# Patient Record
Sex: Female | Born: 1950
Health system: Southern US, Community
[De-identification: ages and names within clinical notes are randomized; demographics above are authoritative.]

## PROBLEM LIST (undated history)

## (undated) DIAGNOSIS — R32 Unspecified urinary incontinence: Secondary | ICD-10-CM

## (undated) DIAGNOSIS — E785 Hyperlipidemia, unspecified: Secondary | ICD-10-CM

## (undated) DIAGNOSIS — I10 Essential (primary) hypertension: Secondary | ICD-10-CM

## (undated) HISTORY — PX: CATARACT EXTRACTION: SUR2

## (undated) HISTORY — PX: TUBAL LIGATION: SHX77

## (undated) HISTORY — DX: Unspecified urinary incontinence: R32

## (undated) HISTORY — DX: Essential (primary) hypertension: I10

## (undated) HISTORY — DX: Hyperlipidemia, unspecified: E78.5

---

## 2000-04-23 ENCOUNTER — Other Ambulatory Visit: Admission: RE | Admit: 2000-04-23 | Discharge: 2000-04-23 | Payer: Self-pay | Admitting: Internal Medicine

## 2004-08-15 ENCOUNTER — Other Ambulatory Visit: Admission: RE | Admit: 2004-08-15 | Discharge: 2004-08-15 | Payer: Self-pay | Admitting: Internal Medicine

## 2005-09-09 ENCOUNTER — Ambulatory Visit (HOSPITAL_COMMUNITY): Admission: RE | Admit: 2005-09-09 | Discharge: 2005-09-09 | Payer: Self-pay | Admitting: Internal Medicine

## 2006-09-14 ENCOUNTER — Ambulatory Visit (HOSPITAL_COMMUNITY): Admission: RE | Admit: 2006-09-14 | Discharge: 2006-09-14 | Payer: Self-pay | Admitting: Internal Medicine

## 2007-06-23 ENCOUNTER — Encounter (INDEPENDENT_AMBULATORY_CARE_PROVIDER_SITE_OTHER): Payer: Self-pay | Admitting: *Deleted

## 2007-06-23 ENCOUNTER — Ambulatory Visit (HOSPITAL_COMMUNITY): Admission: RE | Admit: 2007-06-23 | Discharge: 2007-06-23 | Payer: Self-pay | Admitting: *Deleted

## 2010-08-07 ENCOUNTER — Other Ambulatory Visit
Admission: RE | Admit: 2010-08-07 | Discharge: 2010-08-07 | Payer: Self-pay | Source: Home / Self Care | Admitting: Internal Medicine

## 2011-04-01 NOTE — Op Note (Signed)
Kelli Mcintyre, Kelli Mcintyre NO.:  [id number]   MEDICAL RECORD NO.:  [id number]          PATIENT TYPE:  AMB   LOCATION:  ENDO                         FACILITY:  [facility]   PHYSICIAN:  Georgiana Spinner, M.D.    DATE OF BIRTH:  1951/03/28   DATE OF PROCEDURE:  DATE OF DISCHARGE:                               OPERATIVE REPORT   PROCEDURE:  Colonoscopy.   INDICATIONS:  Colon polyps, colon cancer screening.   ANESTHESIA:  Fentanyl 75 mcg, Versed 7 mg.   PROCEDURE:  With the patient mildly sedated in the left lateral  decubitus position, subsequently the Pentax videoscopic colonoscope was  inserted to the rectum and passed under direct vision with pressure  applied through a diverticula filled tortuous sigmoid colon.  We reached  the cecum identified by ileocecal valve and appendiceal orifice, both of  which were photographed.  From this point colonoscope was slowly  withdrawn, taking circumferential views of colonic mucosa stopping in  the descending colon where a polyp was seen, photographed and removed  using hot biopsy forceps technique setting of 20/150 blended current.  We next stopped to photograph diverticula seen in the sigmoid colon  until we reached the rectum where a small polyp was seen and removed  using again hot biopsy forceps technique the same setting and in the  rectum we placed the endoscope on retroflexed view to view the anal  canal from above.  Internal hemorrhoids were seen and photographed.  The  endoscope was straightened and withdrawn.  The patient's vital signs,  pulse oximeter remained stable.  The patient tolerated procedure well  without apparent complications.   FINDINGS:  Tortuous colon with diverticulosis of sigmoid colon, internal  hemorrhoids and polyps of rectum and descending colon.  Await biopsy  report.  The patient will call me for results and follow-up with me as  an outpatient.           ______________________________  Georgiana Spinner, M.D.     GMO/MEDQ  D:  06/23/2007  T:  06/23/2007  Job:  [id number]

## 2015-07-25 DIAGNOSIS — L255 Unspecified contact dermatitis due to plants, except food: Secondary | ICD-10-CM | POA: Diagnosis not present

## 2016-01-01 DIAGNOSIS — N644 Mastodynia: Secondary | ICD-10-CM | POA: Diagnosis not present

## 2016-01-03 DIAGNOSIS — H5711 Ocular pain, right eye: Secondary | ICD-10-CM | POA: Diagnosis not present

## 2016-01-31 ENCOUNTER — Encounter: Payer: Self-pay | Admitting: Family Medicine

## 2016-03-27 ENCOUNTER — Other Ambulatory Visit: Payer: Self-pay | Admitting: Family Medicine

## 2016-03-27 ENCOUNTER — Other Ambulatory Visit (HOSPITAL_COMMUNITY)
Admission: RE | Admit: 2016-03-27 | Discharge: 2016-03-27 | Disposition: A | Discharge status: Home / Self Care | Payer: Medicare Other | Source: Ambulatory Visit | Attending: Internal Medicine | Admitting: Internal Medicine

## 2016-03-27 DIAGNOSIS — Z01419 Encounter for gynecological examination (general) (routine) without abnormal findings: Secondary | ICD-10-CM | POA: Diagnosis present

## 2016-04-01 LAB — CYTOLOGY - PAP

## 2016-08-31 ENCOUNTER — Encounter: Payer: Self-pay | Admitting: *Deleted

## 2017-01-02 DIAGNOSIS — Z1231 Encounter for screening mammogram for malignant neoplasm of breast: Secondary | ICD-10-CM | POA: Diagnosis not present

## 2017-03-12 DIAGNOSIS — I10 Essential (primary) hypertension: Secondary | ICD-10-CM | POA: Diagnosis not present

## 2017-03-12 DIAGNOSIS — Z0000 Encounter for general adult medical examination without abnormal findings: Secondary | ICD-10-CM | POA: Diagnosis not present

## 2017-03-12 LAB — LIPID PANEL
Cholesterol: 235 — AB (ref 0–200)
HDL: 54 (ref 35–70)
LDL Cholesterol: 155
Triglycerides: 131 (ref 40–160)

## 2017-03-12 LAB — BASIC METABOLIC PANEL
BUN: 18 (ref 4–21)
Creatinine: 1 (ref 0.5–1.1)
Glucose: 101
POTASSIUM: 4.9 (ref 3.4–5.3)
Sodium: 140 (ref 137–147)

## 2017-03-12 LAB — CBC AND DIFFERENTIAL
HCT: 42 (ref 36–46)
Hemoglobin: 14.3 (ref 12.0–16.0)
Platelets: 230 (ref 150–399)
WBC: 6.7

## 2017-03-12 LAB — HEPATIC FUNCTION PANEL
ALK PHOS: 51 (ref 25–125)
ALT: 21 (ref 7–35)
AST: 26 (ref 13–35)

## 2017-03-17 DIAGNOSIS — M4316 Spondylolisthesis, lumbar region: Secondary | ICD-10-CM | POA: Diagnosis not present

## 2017-03-17 DIAGNOSIS — M5441 Lumbago with sciatica, right side: Secondary | ICD-10-CM | POA: Diagnosis not present

## 2017-03-17 DIAGNOSIS — M8580 Other specified disorders of bone density and structure, unspecified site: Secondary | ICD-10-CM | POA: Diagnosis not present

## 2017-03-17 DIAGNOSIS — I10 Essential (primary) hypertension: Secondary | ICD-10-CM | POA: Diagnosis not present

## 2017-03-17 DIAGNOSIS — Z0000 Encounter for general adult medical examination without abnormal findings: Secondary | ICD-10-CM | POA: Diagnosis not present

## 2017-03-17 DIAGNOSIS — Z780 Asymptomatic menopausal state: Secondary | ICD-10-CM | POA: Diagnosis not present

## 2017-03-25 DIAGNOSIS — M545 Low back pain: Secondary | ICD-10-CM | POA: Diagnosis not present

## 2017-03-25 DIAGNOSIS — M5431 Sciatica, right side: Secondary | ICD-10-CM | POA: Diagnosis not present

## 2017-03-30 DIAGNOSIS — M545 Low back pain: Secondary | ICD-10-CM | POA: Diagnosis not present

## 2017-03-30 DIAGNOSIS — M5431 Sciatica, right side: Secondary | ICD-10-CM | POA: Diagnosis not present

## 2017-06-09 ENCOUNTER — Telehealth: Payer: Self-pay | Admitting: Family Medicine

## 2017-06-09 NOTE — Telephone Encounter (Signed)
[facility]

## 2017-06-12 ENCOUNTER — Encounter: Payer: Self-pay | Admitting: *Deleted

## 2017-06-12 ENCOUNTER — Telehealth: Payer: Self-pay | Admitting: *Deleted

## 2017-06-12 NOTE — Telephone Encounter (Signed)
Spoke to pt, went over pt's history pt only told me no allergies, and no surgeries said she has medications written down on paperwork she filled out. Told her okay. Asked her why she was coming in to see Dr. Earlene PlaterWallace? Pt said she is having leg and back pain. Told her okay thank you will see you Monday. Pt verbalized understanding.

## 2017-06-15 ENCOUNTER — Encounter: Payer: Self-pay | Admitting: Family Medicine

## 2017-06-15 ENCOUNTER — Ambulatory Visit (INDEPENDENT_AMBULATORY_CARE_PROVIDER_SITE_OTHER): Payer: Medicare Other | Admitting: Family Medicine

## 2017-06-15 VITALS — BP 146/90 | HR 75 | Temp 97.9°F | Ht 60.5 in | Wt 156.0 lb

## 2017-06-15 DIAGNOSIS — G8929 Other chronic pain: Secondary | ICD-10-CM

## 2017-06-15 DIAGNOSIS — M5441 Lumbago with sciatica, right side: Secondary | ICD-10-CM | POA: Diagnosis not present

## 2017-06-15 DIAGNOSIS — M533 Sacrococcygeal disorders, not elsewhere classified: Secondary | ICD-10-CM

## 2017-06-15 DIAGNOSIS — M47816 Spondylosis without myelopathy or radiculopathy, lumbar region: Secondary | ICD-10-CM | POA: Insufficient documentation

## 2017-06-15 DIAGNOSIS — I10 Essential (primary) hypertension: Secondary | ICD-10-CM | POA: Diagnosis not present

## 2017-06-15 DIAGNOSIS — M549 Dorsalgia, unspecified: Secondary | ICD-10-CM | POA: Insufficient documentation

## 2017-06-15 MED ORDER — METHYLPREDNISOLONE ACETATE 80 MG/ML IJ SUSP
80.0000 mg | Freq: Once | INTRAMUSCULAR | Status: AC
  Administered 2017-06-15: 80 mg via INTRAMUSCULAR

## 2017-06-15 MED ORDER — LOSARTAN POTASSIUM 100 MG PO TABS: 100.0000 mg | ORAL_TABLET | Freq: Every day | ORAL | 3 refills | Status: DC

## 2017-06-15 MED ORDER — DICLOFENAC SODIUM 75 MG PO TBEC: 75.0000 mg | DELAYED_RELEASE_TABLET | Freq: Two times a day (BID) | ORAL | 3 refills | Status: DC

## 2017-06-15 NOTE — Patient Instructions (Addendum)
You will be contacted to schedule the MRI.  START diclofenac 1 tablet twice daily.  Stop Valsartan next Monday (June 22, 2017)  Start Losartan 1 tablet daily next Monday (June 22, 2017)  Follow up in 1 month.

## 2017-06-15 NOTE — Progress Notes (Signed)
  Review of Systems  Constitutional: Negative for chills, fever, malaise/fatigue and weight loss.  HENT: Negative for hearing loss, sinus pain and sore throat.   Eyes: Negative for blurred vision.  Respiratory: Negative for cough and shortness of breath.   Cardiovascular: Negative for chest pain, palpitations and leg swelling.  Gastrointestinal: Negative for abdominal pain, constipation, diarrhea, heartburn, nausea and vomiting.  Genitourinary: Negative for dysuria, frequency and urgency.  Musculoskeletal: Positive for back pain. Negative for myalgias and neck pain.  Skin: Negative for itching and rash.  Neurological: Negative for dizziness, tingling, seizures, loss of consciousness and headaches.  Endo/Heme/Allergies: Negative for polydipsia.  Psychiatric/Behavioral: Negative for depression. The patient is not nervous/anxious.    Physical Exam

## 2017-06-15 NOTE — Progress Notes (Signed)
Kelli Mcintyre is a 66 y.o. [facility]   Patient Care Team: Helane RimaWallace, Bobbiejo Ishikawa, DO as PCP - General (Family Medicine)   History of Present Illness:   Kelli Mcintyre, CMA, acting as scribe for Dr. Earlene PlaterWallace.  Patient accompanied by interpreter today.  HPI:  Patient states she has pain in her lower back that radiates down her right thigh.  This has been occurring since winter.  States she has never been told she has arthritis.  Takes Advil occasionally and it provides some relief.  She is able to sleep well.  This only bothers her when walking.  She has pain in her lower back upon palpation.  No pain when bending forward.  Mild pain when bending backward.  Patient states it is a mild discomfort.  No pain when twisting or bending side to side.   She went to physical therapy a few times.  She states physical therapy was not helpful.  Patient does not work.  States she takes care of her garden.  She has not tried prednisone or injections for this.  She tried acupuncture but states it did not work.    Patient is taking valsartan for her blood pressure.  This will be changed to Losartan today due to product recall.  Patient states she had lab work done on Mar 21, 2017.  We will request those records.  Health Maintenance Due  Topic Date Due  . Hepatitis C Screening  [birth year]  . TETANUS/TDAP  03/21/1969  . COLONOSCOPY  03/21/2000  . MAMMOGRAM  09/14/2008  . DEXA SCAN  03/22/2015  . PNA vac Low Risk Adult (1 of 2 - PCV13) 03/22/2015   Depression screen PHQ 2/9 06/15/2017  Decreased Interest 0  Down, Depressed, Hopeless 0  PHQ - 2 Score 0   PMHx, SurgHx, SocialHx, Medications, and Allergies were reviewed in the Visit Navigator and updated as appropriate.   Past Medical History:  Diagnosis Date  . Hypertension    History reviewed. No pertinent surgical history. History reviewed. No pertinent family history. Social History  Substance Use Topics  . Smoking status: Never  Smoker  . Smokeless tobacco: Never Used  . Alcohol use No   Current Medications and Allergies:   .  Ascorbic Acid (VITAMIN C) 1000 MG tablet, Take 1,000 mg by mouth daily., Disp: , Rfl:  .  Calcium-Magnesium-Vitamin D (CALCIUM 500 PO), Take 1 tablet by mouth 2 (two) times daily., Disp: , Rfl:  .  Glucos-Chondroit-Hyaluron-MSM (GLUCOSAMINE CHONDROITIN JOINT PO), Take by mouth. Pt takes liquid, 2 tablespoons daily., Disp: , Rfl:  .  Multiple Vitamin (MULTIVITAMIN) tablet, Take 1 tablet by mouth daily., Disp: , Rfl:  .  valsartan (DIOVAN) 320 MG tablet, Take 320 mg by mouth daily., Disp: , Rfl: 4 .  diclofenac (VOLTAREN) 75 MG EC tablet, Take 1 tablet (75 mg total) by mouth 2 (two) times daily., Disp: 30 tablet, Rfl: 3 .  losartan (COZAAR) 100 MG tablet, Take 1 tablet (100 mg total) by mouth daily., Disp: 30 tablet, Rfl: 3  No Known Allergies   Review of Systems:   Pertinent items are noted in the HPI. Otherwise, ROS is negative.  Vitals:   Vitals:   06/15/17 0934  BP: (!) 146/90  Pulse: 75  Temp: 97.9 F (36.6 C)  TempSrc: Oral  SpO2: 96%  Weight: 156 lb (70.8 kg)  Height: 5' 0.5" (1.537 m)     Body mass index is 29.97 kg/m.  Physical  Exam:   Physical Exam  Constitutional: She appears well-developed and well-nourished. No distress.  HENT:  Head: Normocephalic and atraumatic.  Right Ear: External ear normal.  Left Ear: External ear normal.  Nose: Nose normal.  Mouth/Throat: Oropharynx is clear and moist.  Eyes: Pupils are equal, round, and reactive to light. EOM are normal.  Neck: Normal range of motion. Neck supple.  Cardiovascular: Normal rate, regular rhythm, normal heart sounds and intact distal pulses.   Pulmonary/Chest: Effort normal and breath sounds normal.  Abdominal: Soft.  Musculoskeletal:       Back:       Legs: ttp bilateral SI joints, + SLR right  Skin: Skin is warm.  Psychiatric: She has a normal mood and affect. Her behavior is normal.  Nursing  note and vitals reviewed.   Assessment and Plan:   Kelli Mcintyre was seen today for establish care and back pain.  Diagnoses and all orders for this visit:  Chronic bilateral low back pain with right-sided sciatica Comments: Worsening. Not improved with PT. Hx of XRAY. Will request records. See intervention below. Red flags reviewed. Orders: -     MR Lumbar Spine Wo Contrast; Future -     diclofenac (VOLTAREN) 75 MG EC tablet; Take 1 tablet (75 mg total) by mouth 2 (two) times daily. -     methylPREDNISolone acetate (DEPO-MEDROL) injection 80 mg; Inject 1 mL (80 mg total) into the muscle once.  Essential hypertension Comments: Will change medication to Cozaar due to Diovan recall.  Orders: -     losartan (COZAAR) 100 MG tablet; Take 1 tablet (100 mg total) by mouth daily.  SI (sacroiliac) pain Comments: Chronic. Worsening. Not improving with PT. See orders above.   . Reviewed expectations re: course of current medical issues. . Discussed self-management of symptoms. . Outlined signs and symptoms indicating need for more acute intervention. . Patient verbalized understanding and all questions were answered. Marland Kitchen. Health Maintenance issues including appropriate healthy diet, exercise, and smoking avoidance were discussed with patient. . See orders for this visit as documented in the electronic medical record. . Patient received an After Visit Summary.  CMA served as Neurosurgeonscribe during this visit. History, Physical, and Plan performed by medical provider. The above documentation has been reviewed and is accurate and complete. Helane RimaErica Lyrika Souders, D.O.  Helane RimaErica Amario Longmore, DO [place] 06/15/2017  Future Appointments Date Time Provider Department Center  09/17/2017 9:15 AM Helane RimaWallace, Carling Liberman, DO [facility] None

## 2017-06-16 ENCOUNTER — Encounter: Payer: Self-pay | Admitting: Family Medicine

## 2017-06-29 ENCOUNTER — Ambulatory Visit
Admission: RE | Admit: 2017-06-29 | Discharge: 2017-06-29 | Disposition: A | Discharge status: Home / Self Care | Payer: Medicare Other | Source: Ambulatory Visit | Attending: Family Medicine | Admitting: Family Medicine

## 2017-06-29 DIAGNOSIS — M5441 Lumbago with sciatica, right side: Principal | ICD-10-CM

## 2017-06-29 DIAGNOSIS — M48061 Spinal stenosis, lumbar region without neurogenic claudication: Secondary | ICD-10-CM | POA: Diagnosis not present

## 2017-06-29 DIAGNOSIS — G8929 Other chronic pain: Secondary | ICD-10-CM

## 2017-07-03 ENCOUNTER — Telehealth: Payer: Self-pay | Admitting: Family Medicine

## 2017-07-03 NOTE — Telephone Encounter (Signed)
Patient's husband called in reference to MRI results for patient. MRI was 06/22/17. Please call patient and advise. Ok to leave message.

## 2017-07-04 ENCOUNTER — Encounter: Payer: Self-pay | Admitting: Family Medicine

## 2017-07-06 NOTE — Telephone Encounter (Signed)
I have sent My Chart message for Dr. Earlene Plater to answer.

## 2017-07-29 ENCOUNTER — Encounter: Payer: Self-pay | Admitting: *Deleted

## 2017-07-29 DIAGNOSIS — Z23 Encounter for immunization: Secondary | ICD-10-CM

## 2017-09-17 ENCOUNTER — Ambulatory Visit (INDEPENDENT_AMBULATORY_CARE_PROVIDER_SITE_OTHER): Payer: Medicare Other | Admitting: Family Medicine

## 2017-09-17 ENCOUNTER — Encounter: Payer: Self-pay | Admitting: Family Medicine

## 2017-09-17 VITALS — BP 160/108 | HR 72 | Temp 98.2°F | Ht 60.5 in | Wt 156.6 lb

## 2017-09-17 DIAGNOSIS — I10 Essential (primary) hypertension: Secondary | ICD-10-CM | POA: Diagnosis not present

## 2017-09-17 DIAGNOSIS — M5441 Lumbago with sciatica, right side: Secondary | ICD-10-CM

## 2017-09-17 DIAGNOSIS — R635 Abnormal weight gain: Secondary | ICD-10-CM | POA: Diagnosis not present

## 2017-09-17 DIAGNOSIS — G8929 Other chronic pain: Secondary | ICD-10-CM

## 2017-09-17 LAB — CBC WITH DIFFERENTIAL/PLATELET
Basophils Absolute: 0.1 10*3/uL (ref 0.0–0.1)
Basophils Relative: 0.8 % (ref 0.0–3.0)
Eosinophils Absolute: 0.3 10*3/uL (ref 0.0–0.7)
Eosinophils Relative: 3.9 % (ref 0.0–5.0)
HCT: 42.9 % (ref 36.0–46.0)
Hemoglobin: 14.3 g/dL (ref 12.0–15.0)
Lymphocytes Relative: 29.5 % (ref 12.0–46.0)
Lymphs Abs: 2.1 10*3/uL (ref 0.7–4.0)
MCHC: 33.5 g/dL (ref 30.0–36.0)
MCV: 89.9 fl (ref 78.0–100.0)
Monocytes Absolute: 0.5 10*3/uL (ref 0.1–1.0)
Monocytes Relative: 7.3 % (ref 3.0–12.0)
Neutro Abs: 4.1 10*3/uL (ref 1.4–7.7)
Neutrophils Relative %: 58.5 % (ref 43.0–77.0)
Platelets: 271 10*3/uL (ref 150.0–400.0)
RBC: 4.77 Mil/uL (ref 3.87–5.11)
RDW: 14.3 % (ref 11.5–15.5)
WBC: 7 10*3/uL (ref 4.0–10.5)

## 2017-09-17 LAB — COMPREHENSIVE METABOLIC PANEL
ALT: 20 U/L (ref 0–35)
AST: 21 U/L (ref 0–37)
Albumin: 4.4 g/dL (ref 3.5–5.2)
Alkaline Phosphatase: 46 U/L (ref 39–117)
BUN: 16 mg/dL (ref 6–23)
CO2: 32 mEq/L (ref 19–32)
Calcium: 9.7 mg/dL (ref 8.4–10.5)
Chloride: 101 mEq/L (ref 96–112)
Creatinine, Ser: 1.04 mg/dL (ref 0.40–1.20)
GFR: 56.09 mL/min — ABNORMAL LOW (ref >60.00)
Glucose, Bld: 106 mg/dL — ABNORMAL HIGH (ref 70–99)
Potassium: 4.2 mEq/L (ref 3.5–5.1)
Sodium: 139 mEq/L (ref 135–145)
Total Bilirubin: 0.9 mg/dL (ref 0.2–1.2)
Total Protein: 7.9 g/dL (ref 6.0–8.3)

## 2017-09-17 LAB — TSH: TSH: 2.74 u[IU]/mL (ref 0.35–4.50)

## 2017-09-17 LAB — HEMOGLOBIN A1C: Hgb A1c MFr Bld: 6.4 % (ref 4.6–6.5)

## 2017-09-17 MED ORDER — CYCLOBENZAPRINE HCL 5 MG PO TABS: 5.0000 mg | ORAL_TABLET | Freq: Every day | ORAL | 0 refills | Status: DC

## 2017-09-17 MED ORDER — DICLOFENAC SODIUM 1 % TD GEL: 2.0000 g | Freq: Four times a day (QID) | TRANSDERMAL | 1 refills | Status: DC

## 2017-09-17 MED ORDER — DICLOFENAC SODIUM 75 MG PO TBEC: 75.0000 mg | DELAYED_RELEASE_TABLET | Freq: Two times a day (BID) | ORAL | 3 refills | Status: DC

## 2017-09-17 MED ORDER — AMLODIPINE BESYLATE 5 MG PO TABS: 5.0000 mg | ORAL_TABLET | Freq: Every day | ORAL | 1 refills | Status: DC

## 2017-09-17 NOTE — Progress Notes (Signed)
Kelli Mcintyre is a 66 y.o. female is here for follow up.  History of Present Illness:   Kelli Mcintyre CMA acting as scribe for Dr. Earlene Plater.  HPI: Patient comes in today for a follow up on her back pian.   Back pain: Patient has chronic low back pain. MRI was done at last visit. We discussed placing a referral to Neurosurgeon for possible injections.   Hypertension: Patient was on Valsartan and due to the recall was switched to losartan at last visit. Her blood pressure has been high so added amlodipine.   Health Maintenance Due  Topic Date Due  . Hepatitis C Screening  [birth year]  . TETANUS/TDAP  03/21/1969  . COLONOSCOPY  03/21/2000  . PNA vac Low Risk Adult (1 of 2 - PCV13) 03/22/2015   Depression screen PHQ 2/9 06/15/2017  Decreased Interest 0  Down, Depressed, Hopeless 0  PHQ - 2 Score 0   PMHx, SurgHx, SocialHx, FamHx, Medications, and Allergies were reviewed in the Visit Navigator and updated as appropriate.   Patient Active Problem List   Diagnosis Date Noted  . Essential hypertension 06/15/2017  . Chronic bilateral low back pain with right-sided sciatica 06/15/2017  . SI (sacroiliac) pain 06/15/2017   Social History  Substance Use Topics  . Smoking status: Never Smoker  . Smokeless tobacco: Never Used  . Alcohol use No   Current Medications and Allergies:   .  Ascorbic Acid (VITAMIN C) 1000 MG tablet, Take 1,000 mg by mouth daily., Disp: , Rfl:  .  Calcium-Magnesium-Vitamin D (CALCIUM 500 PO), Take 1 tablet by mouth 2 (two) times daily., Disp: , Rfl:  .  diclofenac (VOLTAREN) 75 MG EC tablet, Take 1 tablet (75 mg total) by mouth 2 (two) times daily., Disp: 30 tablet, Rfl: 3 .  Glucos-Chondroit-Hyaluron-MSM (GLUCOSAMINE CHONDROITIN JOINT PO), Take by mouth. Pt takes liquid, 2 tablespoons daily., Disp: , Rfl:  .  losartan (COZAAR) 100 MG tablet, Take 1 tablet (100 mg total) by mouth daily., Disp: 30 tablet, Rfl: 3 .  Multiple Vitamin (MULTIVITAMIN) tablet, Take 1  tablet by mouth daily., Disp: , Rfl:  .  amLODipine (NORVASC) 5 MG tablet, Take 1 tablet (5 mg total) by mouth daily., Disp: 90 tablet, Rfl: 1 .  cyclobenzaprine (FLEXERIL) 5 MG tablet, Take 1 tablet (5 mg total) by mouth at bedtime., Disp: 20 tablet, Rfl: 0 .  diclofenac sodium (VOLTAREN) 1 % GEL, Apply 2 g topically 4 (four) times daily., Disp: 100 g, Rfl: 1  No Known Allergies   Review of Systems   Pertinent items are noted in the HPI. Otherwise, ROS is negative.  Vitals:   Vitals:   09/17/17 0908  BP: (!) 160/108  Pulse: 72  Temp: 98.2 F (36.8 C)  TempSrc: Oral  SpO2: 97%  Weight: 156 lb 9.6 oz (71 kg)  Height: 5' 0.5" (1.537 m)     Body mass index is 30.08 kg/m.   Physical Exam:   Physical Exam  Constitutional: She appears well-nourished.  HENT:  Head: Normocephalic and atraumatic.  Eyes: Pupils are equal, round, and reactive to light. EOM are normal.  Neck: Normal range of motion. Neck supple.  Cardiovascular: Normal rate, regular rhythm, normal heart sounds and intact distal pulses.   Pulmonary/Chest: Effort normal.  Abdominal: Soft.  Skin: Skin is warm.  Psychiatric: She has a normal mood and affect. Her behavior is normal.  Nursing note and vitals reviewed.   Assessment and Plan:   Diagnoses and all  orders for this visit:  Weight gain Comments: Will check labs below. The patient is asked to make an attempt to improve diet and exercise patterns to aid in medical management of this problem.  Orders: -     CBC with Differential/Platelet -     Comprehensive metabolic panel -     TSH -     Hemoglobin A1c  Chronic bilateral low back pain with right-sided sciatica Comments: MRI reviewed. Will cancel Neuro/PMR referral for now. Still an option for the future.  Orders: -     diclofenac (VOLTAREN) 75 MG EC tablet; Take 1 tablet (75 mg total) by mouth 2 (two) times daily. -     diclofenac sodium (VOLTAREN) 1 % GEL; Apply 2 g topically 4 (four) times  daily. -     cyclobenzaprine (FLEXERIL) 5 MG tablet; Take 1 tablet (5 mg total) by mouth at bedtime.  Essential hypertension Comments: Avoiding excessive salt intake? [x]   YES  []   NO Trying to exercise on a regular basis? []   YES  [x]   NO Review: taking medications as instructed, no medication side effects noted, no TIAs, no chest pain on exertion, no dyspnea on exertion, no swelling of ankles.   Wt Readings from Last 3 Encounters:  09/17/17 156 lb 9.6 oz (71 kg)  06/15/17 156 lb (70.8 kg)   Reports that she has never smoked. She has never used smokeless tobacco.  BP Readings from Last 3 Encounters:  09/17/17 (!) 160/108  06/15/17 (!) 146/90   Lab Results  Component Value Date   CREATININE 1.04 09/17/2017   Orders: -     amLODipine (NORVASC) 5 MG tablet; Take 1 tablet (5 mg total) by mouth daily.   . Reviewed expectations re: course of current medical issues. . Discussed self-management of symptoms. . Outlined signs and symptoms indicating need for more acute intervention. . Patient verbalized understanding and all questions were answered. Marland Kitchen. Health Maintenance issues including appropriate healthy diet, exercise, and smoking avoidance were discussed with patient. . See orders for this visit as documented in the electronic medical record. . Patient received an After Visit Summary.  CMA served as Neurosurgeonscribe during this visit. History, Physical, and Plan performed by medical provider. The above documentation has been reviewed and is accurate and complete. Helane RimaErica Stoy Fenn, D.O.  Helane RimaErica Gerlene Glassburn, DO [place] 09/17/2017  Future Appointments Date Time Provider Department Center  12/23/2017 10:00 AM Sherrye Payorrummond, Cassandra J, RN [facility] None  12/23/2017 11:20 AM Helane RimaWallace, Surah Pelley, DO [facility] None

## 2017-10-04 ENCOUNTER — Other Ambulatory Visit: Payer: Self-pay | Admitting: Family Medicine

## 2017-10-04 DIAGNOSIS — I10 Essential (primary) hypertension: Secondary | ICD-10-CM

## 2017-10-12 NOTE — Telephone Encounter (Signed)
[facility]

## 2017-12-23 ENCOUNTER — Ambulatory Visit: Payer: Medicare Other | Admitting: *Deleted

## 2017-12-23 ENCOUNTER — Ambulatory Visit (INDEPENDENT_AMBULATORY_CARE_PROVIDER_SITE_OTHER): Payer: Medicare Other | Admitting: Family Medicine

## 2017-12-23 ENCOUNTER — Encounter: Payer: Self-pay | Admitting: Family Medicine

## 2017-12-23 VITALS — BP 132/86 | HR 81 | Temp 97.8°F | Ht 60.5 in | Wt 155.8 lb

## 2017-12-23 DIAGNOSIS — R7303 Prediabetes: Secondary | ICD-10-CM | POA: Diagnosis not present

## 2017-12-23 LAB — POCT GLYCOSYLATED HEMOGLOBIN (HGB A1C): Hemoglobin A1C: 6.3

## 2017-12-23 MED ORDER — METFORMIN HCL 500 MG PO TABS: 1000.0000 mg | ORAL_TABLET | Freq: Two times a day (BID) | ORAL | 3 refills | Status: DC

## 2017-12-23 NOTE — Patient Instructions (Addendum)
   Take a multivitamin every day when you are on Metformin.  Take Metformin 500 mg 1 pill at dinner once daily for 2 weeks  Then, take Metformin 500 mg twice daily.  Increase very slowly until you are taking 2 pills in the morning and 2 pills at night.   This will help your blood sugar and help you lose a few pounds of extra weight.  Recheck in 3 months.

## 2017-12-23 NOTE — Progress Notes (Signed)
Kelli Mcintyre is a 67 y.o. female is here for follow up.  History of Present Illness:  Britt BottomJamie Wheeley CMA acting as scribe for Dr. Earlene PlaterWallace  HPI: Patient comes in today for her follow up. She has no concerns today.   Health Maintenance Due  Topic Date Due  . Hepatitis C Screening  06/03/1951  . TETANUS/TDAP  03/21/1969  . COLONOSCOPY  03/21/2000  . PNA vac Low Risk Adult (1 of 2 - PCV13) 03/22/2015   Depression screen PHQ 2/9 06/15/2017  Decreased Interest 0  Down, Depressed, Hopeless 0  PHQ - 2 Score 0   PMHx, SurgHx, SocialHx, FamHx, Medications, and Allergies were reviewed in the Visit Navigator and updated as appropriate.   Patient Active Problem List   Diagnosis Date Noted  . Essential hypertension 06/15/2017  . Chronic bilateral low back pain with right-sided sciatica 06/15/2017  . SI (sacroiliac) pain 06/15/2017   Social History   Tobacco Use  . Smoking status: Never Smoker  . Smokeless tobacco: Never Used  Substance Use Topics  . Alcohol use: No  . Drug use: No   Current Medications and Allergies:   Current Outpatient Medications:  .  amLODipine (NORVASC) 5 MG tablet, Take 1 tablet (5 mg total) by mouth daily., Disp: 90 tablet, Rfl: 1 .  Ascorbic Acid (VITAMIN C) 1000 MG tablet, Take 1,000 mg by mouth daily., Disp: , Rfl:  .  Calcium-Magnesium-Vitamin D (CALCIUM 500 PO), Take 1 tablet by mouth 2 (two) times daily., Disp: , Rfl:  .  Glucos-Chondroit-Hyaluron-MSM (GLUCOSAMINE CHONDROITIN JOINT PO), Take by mouth. Pt takes liquid, 2 tablespoons daily., Disp: , Rfl:  .  losartan (COZAAR) 100 MG tablet, TAKE 1 TABLET BY MOUTH EVERY DAY, Disp: 30 tablet, Rfl: 3 .  Multiple Vitamin (MULTIVITAMIN) tablet, Take 1 tablet by mouth daily., Disp: , Rfl:   No Known Allergies   Review of Systems   Pertinent items are noted in the HPI. Otherwise, ROS is negative.  Vitals:   Vitals:   12/23/17 1112  BP: 132/86  Pulse: 81  Temp: 97.8 F (36.6 C)  TempSrc: Oral  SpO2:  94%  Weight: 155 lb 12.8 oz (70.7 kg)  Height: 5' 0.5" (1.537 m)     Body mass index is 29.93 kg/m.  Physical Exam:   Physical Exam  Constitutional: She appears well-nourished.  HENT:  Head: Normocephalic and atraumatic.  Eyes: EOM are normal. Pupils are equal, round, and reactive to light.  Neck: Normal range of motion. Neck supple.  Cardiovascular: Normal rate, regular rhythm, normal heart sounds and intact distal pulses.  Pulmonary/Chest: Effort normal.  Abdominal: Soft.  Skin: Skin is warm.  Psychiatric: She has a normal mood and affect. Her behavior is normal.  Nursing note and vitals reviewed.  Results for orders placed or performed in visit on 09/17/17  CBC with Differential/Platelet  Result Value Ref Range   WBC 7.0 4.0 - 10.5 K/uL   RBC 4.77 3.87 - 5.11 Mil/uL   Hemoglobin 14.3 12.0 - 15.0 g/dL   HCT 40.942.9 81.136.0 - 91.446.0 %   MCV 89.9 78.0 - 100.0 fl   MCHC 33.5 30.0 - 36.0 g/dL   RDW 78.214.3 95.611.5 - 21.315.5 %   Platelets 271.0 150.0 - 400.0 K/uL   Neutrophils Relative % 58.5 43.0 - 77.0 %   Lymphocytes Relative 29.5 12.0 - 46.0 %   Monocytes Relative 7.3 3.0 - 12.0 %   Eosinophils Relative 3.9 0.0 - 5.0 %   Basophils  Relative 0.8 0.0 - 3.0 %   Neutro Abs 4.1 1.4 - 7.7 K/uL   Lymphs Abs 2.1 0.7 - 4.0 K/uL   Monocytes Absolute 0.5 0.1 - 1.0 K/uL   Eosinophils Absolute 0.3 0.0 - 0.7 K/uL   Basophils Absolute 0.1 0.0 - 0.1 K/uL  Comprehensive metabolic panel  Result Value Ref Range   Sodium 139 135 - 145 mEq/L   Potassium 4.2 3.5 - 5.1 mEq/L   Chloride 101 96 - 112 mEq/L   CO2 32 19 - 32 mEq/L   Glucose, Bld 106 (H) 70 - 99 mg/dL   BUN 16 6 - 23 mg/dL   Creatinine, Ser 1.61 0.40 - 1.20 mg/dL   Total Bilirubin 0.9 0.2 - 1.2 mg/dL   Alkaline Phosphatase 46 39 - 117 U/L   AST 21 0 - 37 U/L   ALT 20 0 - 35 U/L   Total Protein 7.9 6.0 - 8.3 g/dL   Albumin 4.4 3.5 - 5.2 g/dL   Calcium 9.7 8.4 - 09.6 mg/dL   GFR 04.54 (L) >09.81 mL/min  TSH  Result Value Ref Range    TSH 2.74 0.35 - 4.50 uIU/mL  Hemoglobin A1c  Result Value Ref Range   Hgb A1c MFr Bld 6.4 4.6 - 6.5 %   Assessment and Plan:   1. Prediabetes  Lab Results  Component Value Date   HGBA1C 6.3 12/23/2017   Still elevated. After discussion, patient would like to start below medication. Expectations, risks, and potential side effects reviewed. The patient is asked to make an attempt to improve diet and exercise patterns to aid in medical management of this problem. Okay to formal nutrition education.  - POCT glycosylated hemoglobin (Hb A1C) - Metformin 500 mg po BID   . Reviewed expectations re: course of current medical issues. . Discussed self-management of symptoms. . Outlined signs and symptoms indicating need for more acute intervention. . Patient verbalized understanding and all questions were answered. Marland Kitchen Health Maintenance issues including appropriate healthy diet, exercise, and smoking avoidance were discussed with patient. . See orders for this visit as documented in the electronic medical record. . Patient received an After Visit Summary.  CMA served as Neurosurgeon during this visit. History, Physical, and Plan performed by medical provider. The above documentation has been reviewed and is accurate and complete. Helane Rima, D.O.  Helane Rima, DO [place] 12/26/2017

## 2017-12-23 NOTE — Progress Notes (Signed)
Subjective:   Kelli Mcintyre is a 66 y.o. female who presents for an Initial Medicare Annual Wellness Visit.  Reports health as BP up  A1c 6.4 yesterday/ attempt lifestyle management  Will walk briskly x 3 months and try to lose 10lbs   Diet  Chol 235; hdl 54; ldl 155; trig 131 Z6X 6.4 in 2018 and 6.3  156 and 62ft .05in and BMI 30 Family hx of diabetes and HTN Started on Metformin to titrate up   In the am banana, bb, "ola" Walnuts and milk, grind it and drink; Avocado oil 1/2 tbsp Lunch - broccoli, apple; egg;  Supper; [place] food, rice, Meat, pork Mixes with beans / recommended sweet potatoes and other brown rice or other fiber complex carbs and add protein States she had a lot of sugar over the New Years and will cut that out. Also will start exercising   [place]  Goes to sliver sneakers 2 days a week and will walk briskly 3 days a week.        Health Maintenance Due  Topic Date Due  . Hepatitis C Screening  [birth year]  . TETANUS/TDAP  03/21/1969  . PNA vac Low Risk Adult (1 of 2 - PCV13) 03/22/2015   Mammogram 12/2016- will update this year  dexa 02/2016  -1.0 taking calcium; did not have time to discuss further except for weight bearing exercise  Pap 03/2016  Had colonoscopy; repeat in 5 years Had 2018; clear and will repeat in 10 years She nor the spouse can locate the name of the doctor she went too Will updated epic based on patient information The spouse did translate colonoscopy into [place] for verification To note, no report but patient reported  Educated regarding the shingrix   Cardiac Risk Factors include: advanced age (>56men, >85 women);diabetes mellitus;dyslipidemia;hypertension;obesity (BMI >30kg/m2)     Objective:    Today's Vitals   12/24/17 0843  BP: 110/80  Pulse: 76  SpO2: 95%  Weight: 155 lb (70.3 kg)  Height: 4\' 11"  (1.499 m)   Body mass index is 31.31 kg/m.  Advanced Directives  12/24/2017  Does Patient Have a Medical Advance Directive? Yes    Current Medications (verified) Outpatient Encounter Medications as of 12/24/2017  Medication Sig  . amLODipine (NORVASC) 5 MG tablet Take 1 tablet (5 mg total) by mouth daily.  . Ascorbic Acid (VITAMIN C) 1000 MG tablet Take 1,000 mg by mouth daily.  . Calcium-Magnesium-Vitamin D (CALCIUM 500 PO) Take 1 tablet by mouth 2 (two) times daily.  . Glucos-Chondroit-Hyaluron-MSM (GLUCOSAMINE CHONDROITIN JOINT PO) Take by mouth. Pt takes liquid, 2 tablespoons daily.  Marland Kitchen losartan (COZAAR) 100 MG tablet TAKE 1 TABLET BY MOUTH EVERY DAY  . Multiple Vitamin (MULTIVITAMIN) tablet Take 1 tablet by mouth daily.  . metFORMIN (GLUCOPHAGE) 500 MG tablet Take 2 tablets (1,000 mg total) by mouth 2 (two) times daily with a meal. (Patient not taking: Reported on 12/24/2017)   No facility-administered encounter medications on file as of 12/24/2017.     Allergies (verified) Patient has no known allergies.   History: Past Medical History:  Diagnosis Date  . Hypertension    Past Surgical History:  Procedure Laterality Date  . CATARACT EXTRACTION N/A    Family History  Problem Relation Age of Onset  . Diabetes Mother   . Hypertension Mother    Social History   Socioeconomic History  . Marital status: Married    Spouse name: Not  on file  . Number of children: Not on file  . Years of education: Not on file  . Highest education level: Not on file  Social Needs  . Financial resource strain: Not on file  . Food insecurity - worry: Not on file  . Food insecurity - inability: Not on file  . Transportation needs - medical: Not on file  . Transportation needs - non-medical: Not on file  Occupational History  . Not on file  Tobacco Use  . Smoking status: Never Smoker  . Smokeless tobacco: Never Used  Substance and Sexual Activity  . Alcohol use: No  . Drug use: No  . Sexual activity: No  Other Topics Concern  . Not on file  Social  History Narrative  . Not on file    Tobacco Counseling Counseling given: Yes   Clinical Intake:      Activities of Daily Living In your present state of health, do you have any difficulty performing the following activities: 12/24/2017  Hearing? N  Vision? N  Walking or climbing stairs? N  Dressing or bathing? N  Doing errands, shopping? N  Preparing Food and eating ? N  Using the Toilet? N  In the past six months, have you accidently leaked urine? N  Do you have problems with loss of bowel control? N  Managing your Medications? N  Managing your Finances? N  Housekeeping or managing your Housekeeping? N  Some recent data might be hidden     Immunizations and Health Maintenance Immunization History  Administered Date(s) Administered  . Influenza Split 08/25/2015, 08/30/2016  . Influenza,inj,Quad PF,6+ Mos 07/29/2017   Health Maintenance Due  Topic Date Due  . Hepatitis C Screening  Jun 16, 1951  . TETANUS/TDAP  03/21/1969  . PNA vac Low Risk Adult (1 of 2 - PCV13) 03/22/2015    Patient Care Team: Helane RimaWallace, Erica, DO as PCP - General (Family Medicine)  Indicate any recent Medical Services you may have received from other than [facility] providers in the past year (date may be approximate).     Assessment:   This is a routine wellness examination for Kelli Mcintyre.  Hearing/Vision screen Hearing Screening Comments: Hearing issues no Vision Screening Comments: Vision checks  It has been a long time Dr. Nile RiggsShapiro   Advised to have her vision check this year  Dietary issues and exercise activities discussed: Current Exercise Habits: Structured exercise class, Time (Minutes): 60, Frequency (Times/Week): 5(2 days strength and 3 days of walking), Weekly Exercise (Minutes/Week): 300, Intensity: Moderate  Goals    . Weight (lb) < 145 lb (65.8 kg)     Will eat healthy carbs and exercise!      Depression Screen PHQ 2/9 Scores 12/24/2017 06/15/2017  PHQ - 2 Score 0 0    Fall  Risk Fall Risk  12/24/2017 06/15/2017  Falls in the past year? No No   No falls reported   Cognitive Function: MMSE - Mini Mental State Exam 12/24/2017  Not completed: (No Data)   Ad8 score reviewed for issues:  Issues making decisions:  Less interest in hobbies / activities:  Repeats questions, stories (family complaining):  Trouble using ordinary gadgets (microwave, computer, phone):  Forgets the month or year:   Mismanaging finances:   Remembering appts:  Daily problems with thinking and/or memory: Ad8 score is= 0  Spouse here today to confirm she does not have issues  Waived mental status check due to language barrier        Screening Tests Health  Maintenance  Topic Date Due  . Hepatitis C Screening  [birth year]  . TETANUS/TDAP  03/21/1969  . PNA vac Low Risk Adult (1 of 2 - PCV13) 03/22/2015  . MAMMOGRAM  01/02/2019  . COLONOSCOPY  05/18/2027  . INFLUENZA VACCINE  Completed  . DEXA SCAN  Completed       Plan:      PCP Notes   Health Maintenance  Agreed to update her eye exam.   Was seeing doctors from [facility]. Agreed to complete a medical release form when leaving today to transfer records.  Pneumonia vaccines; States these were completed and will await the medical record  Mammogram due now and stated she will schedule this  Educated regarding tdap and agreed to review  May take at pharmacy due to lesser cost  Educated regarding the shingrix. She has rec'd the zoster but not sure when. Educated the shingrix and to take at pharmacy. She agreed to fup   Educated regarding Hep C; will discuss with Dr. Earlene Plater  Has a HCPOA but will get a copy for the chart     Abnormal Screens  States A1c was 6.4 in the office and spent 20 minutes educating on diabetes   Referrals  No  Patient concerns; Patient felt her feet were cold at hs after taking metformin ac dinner (1st dose). Spouse stated the pill was big and she was overwhelmed  with supply. Wife states she would like to try lifestyle management. She does not want to take the medicine. Educated regarding pre-diabetes and her risk and that this is not generally a side effect of Metformin. Also educated on benefits of medication early on to thwart advancement of disease.   Discussed her diet in detail as well as plans to monitor sugar intake. Discussed carbs and spouse pulled up calorie king to track, recommended substitute rice or at least, have one serving with added beans, protein  etc.  Educated regarding fiber and a gram of carb Spouse participated in the discussion and states they both overate  over the [place]. If in 3 months, her A1c is still elevated, they will try medicine. Discussed the risk of Microvascular disease at pre-diabetic range Patient and spouse voice awareness of issues. Agree to fup with Dr. Earlene Plater in 3 months  (Basket note to Dr. Earlene Plater for any further recommendation)   Nurse Concerns;  as noted   Next PCP apt made for 03/23/2018      I have personally reviewed and noted the following in the patient's chart:   . Medical and social history . Use of alcohol, tobacco or illicit drugs  . Current medications and supplements . Functional ability and status . Nutritional status . Physical activity . Advanced directives . List of other physicians . Hospitalizations, surgeries, and ER visits in previous 12 months . Vitals . Screenings to include cognitive, depression, and falls . Referrals and appointments  In addition, I have reviewed and discussed with patient certain preventive protocols, quality metrics, and best practice recommendations. A written personalized care plan for preventive services as well as general preventive health recommendations were provided to patient.     Montine Circle, RN   12/24/2017

## 2017-12-24 ENCOUNTER — Encounter: Payer: Self-pay | Admitting: *Deleted

## 2017-12-24 ENCOUNTER — Ambulatory Visit (INDEPENDENT_AMBULATORY_CARE_PROVIDER_SITE_OTHER): Payer: Medicare Other | Admitting: *Deleted

## 2017-12-24 ENCOUNTER — Telehealth: Payer: Self-pay

## 2017-12-24 VITALS — BP 110/80 | HR 76 | Ht 59.0 in | Wt 155.0 lb

## 2017-12-24 DIAGNOSIS — Z0000 Encounter for general adult medical examination without abnormal findings: Secondary | ICD-10-CM | POA: Diagnosis not present

## 2017-12-24 NOTE — Progress Notes (Signed)
I have reviewed and agree with note, evaluation, plan. Signing for Dr. Earlene PlaterWallace who is out of the office today. Will also forward to her.   Tana ConchStephen Salome Hautala, MD

## 2017-12-24 NOTE — Telephone Encounter (Signed)
Thanks for speaking with the patient. I would like to offer formal dietician visit if patient accepts.

## 2017-12-24 NOTE — Patient Instructions (Addendum)
Kelli Mcintyre , Thank you for taking time to come for your Medicare Wellness Visit. I appreciate your ongoing commitment to your health goals. Please review the following plan we discussed and let me know if I can assist you in the future.   Please complete a medical release for Kelli Mcintyre is due in Feb. Please schedule  Plan to stop the medicine. Received her 3 month supply and was overwhelmed  Will make apt in 3 months to see dr. Juleen Mcintyre to check labs. Will try lifestyle and exercise as discussed and monitor sugar intake. Will let Dr. Juleen Mcintyre know you felt your feel were cold after taking Metformin Dr. Juleen Mcintyre will fup with you if she determines to do so    A Tetanus is recommended every 10 years. Medicare covers a tetanus if you have a cut or wound; otherwise, there may be a charge. If you had not had a tetanus with pertusses, known as the Tdap, you can take this anytime.  They think they got it when you went to [place] trip  The Centers for Disease Control are now recommending 2 pneumonia vaccinations after 75. The first is the Prevnar 13. This helps to boost your immunity to community acquired pneumonia as well as some protection from bacterial pneumonia  The 2nd is the pneumovax 23, which offers more broad protection!  Please consider taking these as this is your best protection against pneumonia. Will fup with your prior doctor   Shingrix is a vaccine for the prevention of Shingles in Adults 50 and older.  If you are on Medicare, you can request a prescription from your doctor to be filled at a pharmacy.  Please check with your benefits regarding applicable copays or out of pocket expenses.  The Shingrix is given in 2 vaccines approx 8 weeks apart. You must receive the 2nd dose prior to 6 months from receipt of the first.      Can talk to Dr. Juleen Mcintyre about Hep c Medicare now request all "baby boomers" test for possible exposure to Hepatitis C. Many  may have been exposed due to dental work, tatoo's, vaccinations when young. The Hepatitis C virus is dormant for many years and then sometimes will cause liver cancer. If you gave blood in the past 15 years, you were most likely checked for Hep C. If you rec'd blood; you may want to consider testing or if you are high risk for any other reason.   Please update your eye exam this year;   Try to get a copy of your health care power of attorney for our records   These are the goals we discussed: Goals    . Weight (lb) < 145 lb (65.8 kg)     Will eat healthy carbs and exercise!       This is a list of the screening recommended for you and due dates:  Health Maintenance  Topic Date Due  .  Hepatitis C: One time screening is recommended by Center for Disease Control  (CDC) for  adults born from [birth year] through 1966.   01-22-1951  . Tetanus Vaccine  03/21/1969  . Colon Cancer Screening  03/21/2000  . Pneumonia vaccines (1 of 2 - PCV13) 03/22/2015  . Mammogram  01/02/2019  . Flu Shot  Completed  . DEXA scan (bone density measurement)  Completed      Fall Prevention in the Home Falls can cause injuries. They can happen to people of all ages. There are  many things you can do to make your home safe and to help prevent falls. What can I do on the outside of my home?  Regularly fix the edges of walkways and driveways and fix any cracks.  Remove anything that might make you trip as you walk through a door, such as a raised step or threshold.  Trim any bushes or trees on the path to your home.  Use bright outdoor lighting.  Clear any walking paths of anything that might make someone trip, such as rocks or tools.  Regularly check to see if handrails are loose or broken. Make sure that both sides of any steps have handrails.  Any raised decks and porches should have guardrails on the edges.  Have any leaves, snow, or ice cleared regularly.  Use sand or salt on walking paths during  winter.  Clean up any spills in your garage right away. This includes oil or grease spills. What can I do in the bathroom?  Use night lights.  Install grab bars by the toilet and in the tub and shower. Do not use towel bars as grab bars.  Use non-skid mats or decals in the tub or shower.  If you need to sit down in the shower, use a plastic, non-slip stool.  Keep the floor dry. Clean up any water that spills on the floor as soon as it happens.  Remove soap buildup in the tub or shower regularly.  Attach bath mats securely with double-sided non-slip rug tape.  Do not have throw rugs and other things on the floor that can make you trip. What can I do in the bedroom?  Use night lights.  Make sure that you have a light by your bed that is easy to reach.  Do not use any sheets or blankets that are too big for your bed. They should not hang down onto the floor.  Have a firm chair that has side arms. You can use this for support while you get dressed.  Do not have throw rugs and other things on the floor that can make you trip. What can I do in the kitchen?  Clean up any spills right away.  Avoid walking on wet floors.  Keep items that you use a lot in easy-to-reach places.  If you need to reach something above you, use a strong step stool that has a grab bar.  Keep electrical cords out of the way.  Do not use floor polish or wax that makes floors slippery. If you must use wax, use non-skid floor wax.  Do not have throw rugs and other things on the floor that can make you trip. What can I do with my stairs?  Do not leave any items on the stairs.  Make sure that there are handrails on both sides of the stairs and use them. Fix handrails that are broken or loose. Make sure that handrails are as long as the stairways.  Check any carpeting to make sure that it is firmly attached to the stairs. Fix any carpet that is loose or worn.  Avoid having throw rugs at the top or  bottom of the stairs. If you do have throw rugs, attach them to the floor with carpet tape.  Make sure that you have a light switch at the top of the stairs and the bottom of the stairs. If you do not have them, ask someone to add them for you. What else can I do to help prevent  falls?  Wear shoes that: ? Do not have high heels. ? Have rubber bottoms. ? Are comfortable and fit you well. ? Are closed at the toe. Do not wear sandals.  If you use a stepladder: ? Make sure that it is fully opened. Do not climb a closed stepladder. ? Make sure that both sides of the stepladder are locked into place. ? Ask someone to hold it for you, if possible.  Clearly mark and make sure that you can see: ? Any grab bars or handrails. ? First and last steps. ? Where the edge of each step is.  Use tools that help you move around (mobility aids) if they are needed. These include: ? Canes. ? Walkers. ? Scooters. ? Crutches.  Turn on the lights when you go into a dark area. Replace any light bulbs as soon as they burn out.  Set up your furniture so you have a clear path. Avoid moving your furniture around.  If any of your floors are uneven, fix them.  If there are any pets around you, be aware of where they are.  Review your medicines with your doctor. Some medicines can make you feel dizzy. This can increase your chance of falling. Ask your doctor what other things that you can do to help prevent falls. This information is not intended to replace advice given to you by your health care provider. Make sure you discuss any questions you have with your health care provider. Document Released: 08/30/2009 Document Revised: 04/10/2016 Document Reviewed: 12/08/2014 Elsevier Interactive Patient Education  2018 [employer] Maintenance, Female Adopting a healthy lifestyle and getting preventive care can go a long way to promote health and wellness. Talk with your health care provider about what  schedule of regular examinations is right for you. This is a good chance for you to check in with your provider about disease prevention and staying healthy. In between checkups, there are plenty of things you can do on your own. Experts have done a lot of research about which lifestyle changes and preventive measures are most likely to keep you healthy. Ask your health care provider for more information. Weight and diet Eat a healthy diet  Be sure to include plenty of vegetables, fruits, low-fat dairy products, and lean protein.  Do not eat a lot of foods high in solid fats, added sugars, or salt.  Get regular exercise. This is one of the most important things you can do for your health. ? Most adults should exercise for at least 150 minutes each week. The exercise should increase your heart rate and make you sweat (moderate-intensity exercise). ? Most adults should also do strengthening exercises at least twice a week. This is in addition to the moderate-intensity exercise.  Maintain a healthy weight  Body mass index (BMI) is a measurement that can be used to identify possible weight problems. It estimates body fat based on height and weight. Your health care provider can help determine your BMI and help you achieve or maintain a healthy weight.  For females 61 years of age and older: ? A BMI below 18.5 is considered underweight. ? A BMI of 18.5 to 24.9 is normal. ? A BMI of 25 to 29.9 is considered overweight. ? A BMI of 30 and above is considered obese.  Watch levels of cholesterol and blood lipids  You should start having your blood tested for lipids and cholesterol at 68 years of age, then have this test every 5  years.  You may need to have your cholesterol levels checked more often if: ? Your lipid or cholesterol levels are high. ? You are older than 68 years of age. ? You are at high risk for heart disease.  Cancer screening Lung Cancer  Lung cancer screening is recommended  for adults 48-69 years old who are at high risk for lung cancer because of a history of smoking.  A yearly low-dose CT scan of the lungs is recommended for people who: ? Currently smoke. ? Have quit within the past 15 years. ? Have at least a 30-pack-year history of smoking. A pack year is smoking an average of one pack of cigarettes a day for 1 year.  Yearly screening should continue until it has been 15 years since you quit.  Yearly screening should stop if you develop a health problem that would prevent you from having lung cancer treatment.  Breast Cancer  Practice breast self-awareness. This means understanding how your breasts normally appear and feel.  It also means doing regular breast self-exams. Let your health care provider know about any changes, no matter how small.  If you are in your 20s or 30s, you should have a clinical breast exam (CBE) by a health care provider every 1-3 years as part of a regular health exam.  If you are 22 or older, have a CBE every year. Also consider having a breast X-ray (mammogram) every year.  If you have a family history of breast cancer, talk to your health care provider about genetic screening.  If you are at high risk for breast cancer, talk to your health care provider about having an MRI and a mammogram every year.  Breast cancer gene (BRCA) assessment is recommended for women who have family members with BRCA-related cancers. BRCA-related cancers include: ? Breast. ? Ovarian. ? Tubal. ? Peritoneal cancers.  Results of the assessment will determine the need for genetic counseling and BRCA1 and BRCA2 testing.  Cervical Cancer Your health care provider may recommend that you be screened regularly for cancer of the pelvic organs (ovaries, uterus, and vagina). This screening involves a pelvic examination, including checking for microscopic changes to the surface of your cervix (Pap test). You may be encouraged to have this screening done  every 3 years, beginning at age 36.  For women ages 61-65, health care providers may recommend pelvic exams and Pap testing every 3 years, or they may recommend the Pap and pelvic exam, combined with testing for human papilloma virus (HPV), every 5 years. Some types of HPV increase your risk of cervical cancer. Testing for HPV may also be done on women of any age with unclear Pap test results.  Other health care providers may not recommend any screening for nonpregnant women who are considered low risk for pelvic cancer and who do not have symptoms. Ask your health care provider if a screening pelvic exam is right for you.  If you have had past treatment for cervical cancer or a condition that could lead to cancer, you need Pap tests and screening for cancer for at least 20 years after your treatment. If Pap tests have been discontinued, your risk factors (such as having a new sexual partner) need to be reassessed to determine if screening should resume. Some women have medical problems that increase the chance of getting cervical cancer. In these cases, your health care provider may recommend more frequent screening and Pap tests.  Colorectal Cancer  This type of cancer can  be detected and often prevented.  Routine colorectal cancer screening usually begins at 68 years of age and continues through 68 years of age.  Your health care provider may recommend screening at an earlier age if you have risk factors for colon cancer.  Your health care provider may also recommend using home test kits to check for hidden blood in the stool.  A small camera at the end of a tube can be used to examine your colon directly (sigmoidoscopy or colonoscopy). This is done to check for the earliest forms of colorectal cancer.  Routine screening usually begins at age 72.  Direct examination of the colon should be repeated every 5-10 years through 68 years of age. However, you may need to be screened more often if  early forms of precancerous polyps or small growths are found.  Skin Cancer  Check your skin from head to toe regularly.  Tell your health care provider about any new moles or changes in moles, especially if there is a change in a mole's shape or color.  Also tell your health care provider if you have a mole that is larger than the size of a pencil eraser.  Always use sunscreen. Apply sunscreen liberally and repeatedly throughout the day.  Protect yourself by wearing long sleeves, pants, a wide-brimmed hat, and sunglasses whenever you are outside.  Heart disease, diabetes, and high blood pressure  High blood pressure causes heart disease and increases the risk of stroke. High blood pressure is more likely to develop in: ? People who have blood pressure in the high end of the normal range (130-139/85-89 mm Hg). ? People who are overweight or obese. ? People who are African American.  If you are 69-35 years of age, have your blood pressure checked every 3-5 years. If you are 67 years of age or older, have your blood pressure checked every year. You should have your blood pressure measured twice-once when you are at a hospital or clinic, and once when you are not at a hospital or clinic. Record the average of the two measurements. To check your blood pressure when you are not at a hospital or clinic, you can use: ? An automated blood pressure machine at a pharmacy. ? A home blood pressure monitor.  If you are between 72 years and 34 years old, ask your health care provider if you should take aspirin to prevent strokes.  Have regular diabetes screenings. This involves taking a blood sample to check your fasting blood sugar level. ? If you are at a normal weight and have a low risk for diabetes, have this test once every three years after 68 years of age. ? If you are overweight and have a high risk for diabetes, consider being tested at a younger age or more often. Preventing  infection Hepatitis B  If you have a higher risk for hepatitis B, you should be screened for this virus. You are considered at high risk for hepatitis B if: ? You were born in a country where hepatitis B is common. Ask your health care provider which countries are considered high risk. ? Your parents were born in a high-risk country, and you have not been immunized against hepatitis B (hepatitis B vaccine). ? You have HIV or AIDS. ? You use needles to inject street drugs. ? You live with someone who has hepatitis B. ? You have had sex with someone who has hepatitis B. ? You get hemodialysis treatment. ? You take certain  medicines for conditions, including cancer, organ transplantation, and autoimmune conditions.  Hepatitis C  Blood testing is recommended for: ? Everyone born from [birth year] through 1966. ? Anyone with known risk factors for hepatitis C.  Sexually transmitted infections (STIs)  You should be screened for sexually transmitted infections (STIs) including gonorrhea and chlamydia if: ? You are sexually active and are younger than 68 years of age. ? You are older than 68 years of age and your health care provider tells you that you are at risk for this type of infection. ? Your sexual activity has changed since you were last screened and you are at an increased risk for chlamydia or gonorrhea. Ask your health care provider if you are at risk.  If you do not have HIV, but are at risk, it may be recommended that you take a prescription medicine daily to prevent HIV infection. This is called pre-exposure prophylaxis (PrEP). You are considered at risk if: ? You are sexually active and do not regularly use condoms or know the HIV status of your partner(s). ? You take drugs by injection. ? You are sexually active with a partner who has HIV.  Talk with your health care provider about whether you are at high risk of being infected with HIV. If you choose to begin PrEP, you should first be  tested for HIV. You should then be tested every 3 months for as long as you are taking PrEP. Pregnancy  If you are premenopausal and you may become pregnant, ask your health care provider about preconception counseling.  If you may become pregnant, take 400 to 800 micrograms (mcg) of folic acid every day.  If you want to prevent pregnancy, talk to your health care provider about birth control (contraception). Osteoporosis and menopause  Osteoporosis is a disease in which the bones lose minerals and strength with aging. This can result in serious bone fractures. Your risk for osteoporosis can be identified using a bone density scan.  If you are 64 years of age or older, or if you are at risk for osteoporosis and fractures, ask your health care provider if you should be screened.  Ask your health care provider whether you should take a calcium or vitamin D supplement to lower your risk for osteoporosis.  Menopause may have certain physical symptoms and risks.  Hormone replacement therapy may reduce some of these symptoms and risks. Talk to your health care provider about whether hormone replacement therapy is right for you. Follow these instructions at home:  Schedule regular health, dental, and eye exams.  Stay current with your immunizations.  Do not use any tobacco products including cigarettes, chewing tobacco, or electronic cigarettes.  If you are pregnant, do not drink alcohol.  If you are breastfeeding, limit how much and how often you drink alcohol.  Limit alcohol intake to no more than 1 drink per day for nonpregnant women. One drink equals 12 ounces of beer, 5 ounces of wine, or 1 ounces of hard liquor.  Do not use street drugs.  Do not share needles.  Ask your health care provider for help if you need support or information about quitting drugs.  Tell your health care provider if you often feel depressed.  Tell your health care provider if you have ever been abused  or do not feel safe at home. This information is not intended to replace advice given to you by your health care provider. Make sure you discuss any questions you have with your  health care provider. Document Released: 05/19/2011 Document Revised: 04/10/2016 Document Reviewed: 08/07/2015 Elsevier Interactive Patient Education  [employer].

## 2017-12-24 NOTE — Telephone Encounter (Signed)
Just [name] ChildesFYI, AWV 1/7  Patient c/o her feet were cold at hs after taking metformin ac dinner (1st dose). Spouse stated the pill was big and she was overwhelmed with supply. Wife states she would like to try lifestyle management. She does not want to take the medicine. Educated regarding pre-diabetes and her risk and that this is not generally a side effect of Metformin. Also educated on benefits of medication early on to thwart advancing disease.  Discussed her diet in detail as well as plans to monitor sugar intake. Discussed carbs and spouse pulled up calorie king to track, recommended substitute rice or at least, have one serving with added beans, protein  etc.  Educated regarding fiber and a gram of carb in regard to her diet  Spouse participated in the discussion and states they both over[place]  [place] the [place]. If in 3 months, her A1c is still elevated, they will try medicine. Discussed the risk of Microvascular disease at pre-diabetic range Discussed treating with Metformin is the standard of care.  Patient and spouse voice awareness of issues.  Agree to fup with Dr. Earlene PlaterWallace in 3 months   Please advise of any fup at this juncture. Tks

## 2017-12-25 ENCOUNTER — Other Ambulatory Visit: Payer: Self-pay

## 2017-12-25 DIAGNOSIS — I10 Essential (primary) hypertension: Secondary | ICD-10-CM

## 2017-12-25 MED ORDER — LOSARTAN POTASSIUM 100 MG PO TABS: 100.0000 mg | ORAL_TABLET | Freq: Every day | ORAL | 1 refills | Status: DC

## 2017-12-25 NOTE — Telephone Encounter (Signed)
FUP with CMA for Dr. Earlene PlaterWallace. [facility] - [phone] for dietetic services   Call to Mr. And Mrs. Selena BattenKim and left a VM for them to call me back at the [facility] number to discuss referral.  Montine CircleSusan Britten Parady RN

## 2017-12-26 ENCOUNTER — Encounter: Payer: Self-pay | Admitting: Family Medicine

## 2017-12-30 ENCOUNTER — Other Ambulatory Visit: Payer: Self-pay

## 2017-12-30 NOTE — Progress Notes (Unsigned)
Error

## 2017-12-30 NOTE — Telephone Encounter (Signed)
2nd outreach to Mr. And Mrs. Kelli Mcintyre Spoke to Kelli Mcintyre. Discussed her seeing a dietician to assist her in achieving the best results with her diet and exercise. Mr. Kelli Mcintyre agreed. [facility] and will send referral information to Mr. Kelli Mcintyre by Kelli Mcintyre

## 2017-12-31 ENCOUNTER — Other Ambulatory Visit: Payer: Self-pay

## 2017-12-31 DIAGNOSIS — Z87898 Personal history of other specified conditions: Secondary | ICD-10-CM

## 2017-12-31 NOTE — Progress Notes (Unsigned)
Order for Dietician to assist with assessment and evaluation of meal management to enhance patient's efforts to bring her A1c down.   My chart message to [name] as advised.

## 2018-01-08 DIAGNOSIS — Z1231 Encounter for screening mammogram for malignant neoplasm of breast: Secondary | ICD-10-CM | POA: Diagnosis not present

## 2018-03-06 ENCOUNTER — Other Ambulatory Visit: Payer: Self-pay | Admitting: Family Medicine

## 2018-03-06 DIAGNOSIS — I10 Essential (primary) hypertension: Secondary | ICD-10-CM

## 2018-03-23 ENCOUNTER — Ambulatory Visit: Payer: Medicare Other | Admitting: Family Medicine

## 2018-03-23 ENCOUNTER — Encounter: Payer: Self-pay | Admitting: Family Medicine

## 2018-03-23 VITALS — BP 126/84 | HR 68 | Temp 98.2°F | Ht 60.5 in | Wt 150.0 lb

## 2018-03-23 DIAGNOSIS — E8881 Metabolic syndrome: Secondary | ICD-10-CM | POA: Insufficient documentation

## 2018-03-23 DIAGNOSIS — M8580 Other specified disorders of bone density and structure, unspecified site: Secondary | ICD-10-CM | POA: Insufficient documentation

## 2018-03-23 DIAGNOSIS — E7800 Pure hypercholesterolemia, unspecified: Secondary | ICD-10-CM

## 2018-03-23 DIAGNOSIS — R7303 Prediabetes: Secondary | ICD-10-CM | POA: Diagnosis not present

## 2018-03-23 DIAGNOSIS — I10 Essential (primary) hypertension: Secondary | ICD-10-CM | POA: Diagnosis not present

## 2018-03-23 NOTE — Progress Notes (Signed)
Kelli Mcintyre is a 67 y.o. female is here for follow up.  History of Present Illness:   HPI: Patient here for follow-up for evaluation of insulin resistance.  She started metformin after last visit.  She continues to take 500 mg twice daily and tolerates this well.  She has made diet changes and has lost 5 pounds.  Her back is improved with stretching classes and she is trying to exercise more often as well.  She does ask for a glucometer and strips today so that she can monitor her blood sugars.  There are no preventive care reminders to display for this patient.   Depression screen [facility] 2/9 12/24/2017 06/15/2017  Decreased Interest 0 0  Down, Depressed, Hopeless 0 0  PHQ - 2 Score 0 0   PMHx, SurgHx, SocialHx, FamHx, Medications, and Allergies were reviewed in the Visit Navigator and updated as appropriate.   Patient Active Problem List   Diagnosis Date Noted  . Prediabetes 03/23/2018  . Osteopenia 03/23/2018  . Pure hypercholesterolemia 03/23/2018  . Essential hypertension 06/15/2017  . Chronic bilateral low back pain with right-sided sciatica 06/15/2017  . SI (sacroiliac) pain 06/15/2017   Social History   Tobacco Use  . Smoking status: Never Smoker  . Smokeless tobacco: Never Used  Substance Use Topics  . Alcohol use: No  . Drug use: No   Current Medications and Allergies:   Current Outpatient Medications:  .  amLODipine (NORVASC) 5 MG tablet, TAKE 1 TABLET BY MOUTH EVERY DAY, Disp: 90 tablet, Rfl: 1 .  Ascorbic Acid (VITAMIN C) 1000 MG tablet, Take 1,000 mg by mouth daily., Disp: , Rfl:  .  Calcium-Magnesium-Vitamin D (CALCIUM 500 PO), Take 1 tablet by mouth 2 (two) times daily., Disp: , Rfl:  .  Glucos-Chondroit-Hyaluron-MSM (GLUCOSAMINE CHONDROITIN JOINT PO), Take by mouth. Pt takes liquid, 2 tablespoons daily., Disp: , Rfl:  .  losartan (COZAAR) 100 MG tablet, Take 1 tablet (100 mg total) by mouth daily., Disp: 90 tablet, Rfl: 1 .  metFORMIN (GLUCOPHAGE) 500 MG tablet,  Take 2 tablets (1,000 mg total) by mouth 2 (two) times daily with a meal., Disp: 180 tablet, Rfl: 3 .  Multiple Vitamin (MULTIVITAMIN) tablet, Take 1 tablet by mouth daily., Disp: , Rfl:   No Known Allergies   Review of Systems   Pertinent items are noted in the HPI. Otherwise, ROS is negative.  Vitals:   Vitals:   03/23/18 0857  BP: 126/84  Pulse: 68  Temp: 98.2 F (36.8 C)  TempSrc: Oral  SpO2: 97%  Weight: 150 lb (68 kg)  Height: 5' 0.5" (1.537 m)     Body mass index is 28.81 kg/m.   Physical Exam:   Physical Exam  Constitutional: She appears well-nourished.  HENT:  Head: Normocephalic and atraumatic.  Eyes: Pupils are equal, round, and reactive to light. EOM are normal.  Neck: Normal range of motion. Neck supple.  Cardiovascular: Normal rate, regular rhythm, normal heart sounds and intact distal pulses.  Pulmonary/Chest: Effort normal.  Abdominal: Soft.  Skin: Skin is warm.  Psychiatric: She has a normal mood and affect. Her behavior is normal.  Nursing note and vitals reviewed.   Assessment and Plan:   Kelli Mcintyre was seen today for follow-up.  Diagnoses and all orders for this visit:  Prediabetes Comments: Improved to 5.9. Continue current management.   Essential hypertension Comments: Doing well. Continue current treatment.   Pure hypercholesterolemia Comments: Will monitor and recheck at next visit.   Marland Kitchen  Reviewed expectations re: course of current medical issues. . Discussed self-management of symptoms. . Outlined signs and symptoms indicating need for more acute intervention. . Patient verbalized understanding and all questions were answered. Marland Kitchen Health Maintenance issues including appropriate healthy diet, exercise, and smoking avoidance were discussed with patient. . See orders for this visit as documented in the electronic medical record. . Patient received an After Visit Summary.  Helane Rima, DO , [facility] 03/23/2018  No future  appointments.

## 2018-06-17 ENCOUNTER — Other Ambulatory Visit: Payer: Self-pay | Admitting: Family Medicine

## 2018-06-17 DIAGNOSIS — I10 Essential (primary) hypertension: Secondary | ICD-10-CM

## 2018-06-23 ENCOUNTER — Ambulatory Visit (INDEPENDENT_AMBULATORY_CARE_PROVIDER_SITE_OTHER): Payer: Medicare Other | Admitting: Family Medicine

## 2018-06-23 ENCOUNTER — Encounter: Payer: Self-pay | Admitting: Family Medicine

## 2018-06-23 VITALS — BP 132/84 | HR 72 | Temp 98.1°F | Ht 60.5 in | Wt 144.2 lb

## 2018-06-23 DIAGNOSIS — E88819 Insulin resistance, unspecified: Secondary | ICD-10-CM

## 2018-06-23 DIAGNOSIS — M5441 Lumbago with sciatica, right side: Secondary | ICD-10-CM | POA: Diagnosis not present

## 2018-06-23 DIAGNOSIS — E8881 Metabolic syndrome: Secondary | ICD-10-CM | POA: Diagnosis not present

## 2018-06-23 DIAGNOSIS — G8929 Other chronic pain: Secondary | ICD-10-CM | POA: Diagnosis not present

## 2018-06-23 DIAGNOSIS — I10 Essential (primary) hypertension: Secondary | ICD-10-CM | POA: Diagnosis not present

## 2018-06-23 DIAGNOSIS — E7800 Pure hypercholesterolemia, unspecified: Secondary | ICD-10-CM | POA: Diagnosis not present

## 2018-06-23 LAB — COMPREHENSIVE METABOLIC PANEL
ALT: 16 U/L (ref 0–35)
AST: 18 U/L (ref 0–37)
Albumin: 4.2 g/dL (ref 3.5–5.2)
Alkaline Phosphatase: 41 U/L (ref 39–117)
BUN: 15 mg/dL (ref 6–23)
CO2: 31 mEq/L (ref 19–32)
Calcium: 9.9 mg/dL (ref 8.4–10.5)
Chloride: 104 mEq/L (ref 96–112)
Creatinine, Ser: 1.07 mg/dL (ref 0.40–1.20)
GFR: 54.16 mL/min — ABNORMAL LOW (ref >60.00)
Glucose, Bld: 100 mg/dL — ABNORMAL HIGH (ref 70–99)
Potassium: 4.6 mEq/L (ref 3.5–5.1)
Sodium: 140 mEq/L (ref 135–145)
Total Bilirubin: 0.7 mg/dL (ref 0.2–1.2)
Total Protein: 7.3 g/dL (ref 6.0–8.3)

## 2018-06-23 LAB — CBC WITH DIFFERENTIAL/PLATELET
Basophils Absolute: 0.1 10*3/uL (ref 0.0–0.1)
Basophils Relative: 0.9 % (ref 0.0–3.0)
Eosinophils Absolute: 0.1 10*3/uL (ref 0.0–0.7)
Eosinophils Relative: 1.5 % (ref 0.0–5.0)
HCT: 39.6 % (ref 36.0–46.0)
Hemoglobin: 13.3 g/dL (ref 12.0–15.0)
Lymphocytes Relative: 31.7 % (ref 12.0–46.0)
Lymphs Abs: 2.5 10*3/uL (ref 0.7–4.0)
MCHC: 33.5 g/dL (ref 30.0–36.0)
MCV: 87.4 fl (ref 78.0–100.0)
Monocytes Absolute: 0.5 10*3/uL (ref 0.1–1.0)
Monocytes Relative: 6.9 % (ref 3.0–12.0)
Neutro Abs: 4.6 10*3/uL (ref 1.4–7.7)
Neutrophils Relative %: 59 % (ref 43.0–77.0)
Platelets: 259 10*3/uL (ref 150.0–400.0)
RBC: 4.53 Mil/uL (ref 3.87–5.11)
RDW: 13.7 % (ref 11.5–15.5)
WBC: 7.8 10*3/uL (ref 4.0–10.5)

## 2018-06-23 LAB — LIPID PANEL
Cholesterol: 227 mg/dL — ABNORMAL HIGH (ref 0–200)
HDL: 49.7 mg/dL (ref >39.00)
LDL Cholesterol: 139 mg/dL — ABNORMAL HIGH (ref 0–99)
NonHDL: 177.33
Total CHOL/HDL Ratio: 5
Triglycerides: 190 mg/dL — ABNORMAL HIGH (ref 0.0–149.0)
VLDL: 38 mg/dL (ref 0.0–40.0)

## 2018-06-23 LAB — HEMOGLOBIN A1C: Hgb A1c MFr Bld: 6.4 % (ref 4.6–6.5)

## 2018-06-23 NOTE — Progress Notes (Signed)
Kelli Mcintyre is a 67 y.o. female is here for follow up.  History of Present Illness:   HPI:   Current symptoms: no polyuria or polydipsia, no chest pain, dyspnea or TIA's, no numbness, tingling or pain in extremities.   Maintaining a diabetic diet? [x]   YES  []   NO Trying to exercise on a regular basis? [x]   YES  []   NO  Lab Results  Component Value Date   HGBA1C 6.4 06/23/2018    No results found for: Concepcion Elk  Lab Results  Component Value Date   CHOL 227 (H) 06/23/2018   HDL 49.70 06/23/2018   LDLCALC 139 (H) 06/23/2018   TRIG 190.0 (H) 06/23/2018   CHOLHDL 5 06/23/2018     Wt Readings from Last 3 Encounters:  06/23/18 144 lb 3.2 oz (65.4 kg)  03/23/18 150 lb (68 kg)  12/24/17 155 lb (70.3 kg)   BP Readings from Last 3 Encounters:  06/23/18 132/84  03/23/18 126/84  12/24/17 110/80   Lab Results  Component Value Date   CREATININE 1.07 06/23/2018   Health Maintenance Due  Topic Date Due  . INFLUENZA VACCINE  06/17/2018   Depression screen [facility] 2/9 12/24/2017 06/15/2017  Decreased Interest 0 0  Down, Depressed, Hopeless 0 0  PHQ - 2 Score 0 0   PMHx, SurgHx, SocialHx, FamHx, Medications, and Allergies were reviewed in the Visit Navigator and updated as appropriate.   Patient Active Problem List   Diagnosis Date Noted  . Prediabetes 03/23/2018  . Osteopenia 03/23/2018  . Pure hypercholesterolemia 03/23/2018  . Essential hypertension 06/15/2017  . Chronic bilateral low back pain with right-sided sciatica 06/15/2017  . SI (sacroiliac) pain 06/15/2017   Social History   Tobacco Use  . Smoking status: Never Smoker  . Smokeless tobacco: Never Used  Substance Use Topics  . Alcohol use: No  . Drug use: No   Current Medications and Allergies:   .  amLODipine (NORVASC) 5 MG tablet, TAKE 1 TABLET BY MOUTH EVERY DAY, Disp: 90 tablet, Rfl: 1 .  Ascorbic Acid (VITAMIN C) 1000 MG tablet, Take 1,000 mg by mouth daily., Disp: , Rfl:  .   Calcium-Magnesium-Vitamin D (CALCIUM 500 PO), Take 1 tablet by mouth 2 (two) times daily., Disp: , Rfl:  .  Glucos-Chondroit-Hyaluron-MSM (GLUCOSAMINE CHONDROITIN JOINT PO), Take by mouth. Pt takes liquid, 2 tablespoons daily., Disp: , Rfl:  .  losartan (COZAAR) 100 MG tablet, TAKE 1 TABLET BY MOUTH EVERY DAY, Disp: 90 tablet, Rfl: 1 .  Multiple Vitamin (MULTIVITAMIN) tablet, Take 1 tablet by mouth daily., Disp: , Rfl:   No Known Allergies   Review of Systems   Pertinent items are noted in the HPI. Otherwise, ROS is negative.  Vitals:   Vitals:   06/23/18 0852  BP: 132/84  Pulse: 72  Temp: 98.1 F (36.7 C)  TempSrc: Oral  SpO2: 97%  Weight: 144 lb 3.2 oz (65.4 kg)  Height: 5' 0.5" (1.537 m)     Body mass index is 27.7 kg/m.  Physical Exam:   Physical Exam  Constitutional: She appears well-nourished.  HENT:  Head: Normocephalic and atraumatic.  Eyes: Pupils are equal, round, and reactive to light. EOM are normal.  Neck: Normal range of motion. Neck supple.  Cardiovascular: Normal rate, regular rhythm, normal heart sounds and intact distal pulses.  Pulmonary/Chest: Effort normal.  Abdominal: Soft.  Skin: Skin is warm.  Psychiatric: She has a normal mood and affect. Her behavior is normal.  Nursing note and vitals reviewed.  Results for orders placed or performed in visit on 06/23/18  CBC with Differential/Platelet  Result Value Ref Range   WBC 7.8 4.0 - 10.5 K/uL   RBC 4.53 3.87 - 5.11 Mil/uL   Hemoglobin 13.3 12.0 - 15.0 g/dL   HCT 60.439.6 54.036.0 - 98.146.0 %   MCV 87.4 78.0 - 100.0 fl   MCHC 33.5 30.0 - 36.0 g/dL   RDW 19.113.7 47.811.5 - 29.515.5 %   Platelets 259.0 150.0 - 400.0 K/uL   Neutrophils Relative % 59.0 43.0 - 77.0 %   Lymphocytes Relative 31.7 12.0 - 46.0 %   Monocytes Relative 6.9 3.0 - 12.0 %   Eosinophils Relative 1.5 0.0 - 5.0 %   Basophils Relative 0.9 0.0 - 3.0 %   Neutro Abs 4.6 1.4 - 7.7 K/uL   Lymphs Abs 2.5 0.7 - 4.0 K/uL   Monocytes Absolute 0.5 0.1 -  1.0 K/uL   Eosinophils Absolute 0.1 0.0 - 0.7 K/uL   Basophils Absolute 0.1 0.0 - 0.1 K/uL  Comprehensive metabolic panel  Result Value Ref Range   Sodium 140 135 - 145 mEq/L   Potassium 4.6 3.5 - 5.1 mEq/L   Chloride 104 96 - 112 mEq/L   CO2 31 19 - 32 mEq/L   Glucose, Bld 100 (H) 70 - 99 mg/dL   BUN 15 6 - 23 mg/dL   Creatinine, Ser 6.211.07 0.40 - 1.20 mg/dL   Total Bilirubin 0.7 0.2 - 1.2 mg/dL   Alkaline Phosphatase 41 39 - 117 U/L   AST 18 0 - 37 U/L   ALT 16 0 - 35 U/L   Total Protein 7.3 6.0 - 8.3 g/dL   Albumin 4.2 3.5 - 5.2 g/dL   Calcium 9.9 8.4 - 30.810.5 mg/dL   GFR 65.7854.16 (L) >46.96>60.00 mL/min  Lipid panel  Result Value Ref Range   Cholesterol 227 (H) 0 - 200 mg/dL   Triglycerides 295.2190.0 (H) 0.0 - 149.0 mg/dL   HDL 84.1349.70 >24.40>39.00 mg/dL   VLDL 10.238.0 0.0 - 72.540.0 mg/dL   LDL Cholesterol 366139 (H) 0 - 99 mg/dL   Total CHOL/HDL Ratio 5    NonHDL 177.33   Hemoglobin A1c  Result Value Ref Range   Hgb A1c MFr Bld 6.4 4.6 - 6.5 %    Assessment and Plan:   Kelli Mcintyre was seen today for follow-up.  Diagnoses and all orders for this visit:  Essential hypertension Comments: No medications today. Would like to stop the Norvasc. Okay to do and monitor BP. Orders: -     CBC with Differential/Platelet -     Comprehensive metabolic panel  Insulin resistance Comments: Stopped Metformin due to diarrhea. Exercising regularly and eating well. Lost five more pounds. Orders: -     Hemoglobin A1c  Chronic bilateral low back pain with right-sided sciatica Comments: Improved. Going to [employer]. Sometimes stiff in the morning. Improved with Motrin.  Pure hypercholesterolemia -     Lipid panel    . Reviewed expectations re: course of current medical issues. . Discussed self-management of symptoms. . Outlined signs and symptoms indicating need for more acute intervention. . Patient verbalized understanding and all questions were answered. Marland Kitchen. Health Maintenance issues including appropriate  healthy diet, exercise, and smoking avoidance were discussed with patient. . See orders for this visit as documented in the electronic medical record. . Patient received an After Visit Summary.  Helane RimaErica Goldie Dimmer, DO [place], [facility] 06/23/2018  Future Appointments  Date  Time Provider Department Center  09/29/2018  9:00 AM Helane Rima, DO [facility]

## 2018-08-05 ENCOUNTER — Encounter: Payer: Self-pay | Admitting: Family Medicine

## 2018-08-10 ENCOUNTER — Encounter: Payer: Self-pay | Admitting: Family Medicine

## 2018-08-13 ENCOUNTER — Other Ambulatory Visit: Payer: Self-pay

## 2018-08-16 ENCOUNTER — Other Ambulatory Visit: Payer: Self-pay

## 2018-08-16 DIAGNOSIS — Z0000 Encounter for general adult medical examination without abnormal findings: Secondary | ICD-10-CM

## 2018-08-16 NOTE — Patient Outreach (Signed)
[facility]) Care Management  08/16/2018  Kelli Mcintyre 1950-11-28 [id number]   Medication Adherence call to Kelli Mcintyre left a message for patient to call back patient is due on Metformin 1000 mg under [employer] care Ins.   Kelli Mcintyre CPhT Pharmacy Technician [facility] Care Management Direct Dial [phone]  Fax [phone] [email or web address]

## 2018-08-16 NOTE — Telephone Encounter (Signed)
Patient referral started

## 2018-08-18 ENCOUNTER — Other Ambulatory Visit: Payer: Self-pay | Admitting: Surgical

## 2018-08-19 NOTE — Progress Notes (Signed)
ERROR

## 2018-08-20 ENCOUNTER — Encounter (INDEPENDENT_AMBULATORY_CARE_PROVIDER_SITE_OTHER): Payer: Medicare Other | Admitting: Family Medicine

## 2018-08-20 DIAGNOSIS — Z23 Encounter for immunization: Secondary | ICD-10-CM | POA: Diagnosis not present

## 2018-08-20 NOTE — Progress Notes (Signed)
Patient in office today for follow up. Visit was not needed patient has evaluation with Dr. Oscar La coming up soon. She would like to hold off on today's visit with Dr. Earlene Plater. She did receive her flu shot today.

## 2018-09-05 ENCOUNTER — Other Ambulatory Visit: Payer: Self-pay | Admitting: Family Medicine

## 2018-09-05 DIAGNOSIS — I10 Essential (primary) hypertension: Secondary | ICD-10-CM

## 2018-09-09 ENCOUNTER — Other Ambulatory Visit: Payer: Self-pay

## 2018-09-09 ENCOUNTER — Ambulatory Visit (INDEPENDENT_AMBULATORY_CARE_PROVIDER_SITE_OTHER): Payer: Medicare Other | Admitting: Obstetrics and Gynecology

## 2018-09-09 ENCOUNTER — Encounter: Payer: Self-pay | Admitting: Obstetrics and Gynecology

## 2018-09-09 ENCOUNTER — Other Ambulatory Visit (HOSPITAL_COMMUNITY)
Admission: RE | Admit: 2018-09-09 | Discharge: 2018-09-09 | Disposition: A | Discharge status: Home / Self Care | Payer: Medicare Other | Source: Ambulatory Visit | Attending: Obstetrics and Gynecology | Admitting: Obstetrics and Gynecology

## 2018-09-09 VITALS — BP 124/88 | HR 72 | Ht 61.5 in | Wt 146.6 lb

## 2018-09-09 DIAGNOSIS — Z1151 Encounter for screening for human papillomavirus (HPV): Secondary | ICD-10-CM | POA: Insufficient documentation

## 2018-09-09 DIAGNOSIS — E785 Hyperlipidemia, unspecified: Secondary | ICD-10-CM | POA: Insufficient documentation

## 2018-09-09 DIAGNOSIS — Z8249 Family history of ischemic heart disease and other diseases of the circulatory system: Secondary | ICD-10-CM | POA: Diagnosis not present

## 2018-09-09 DIAGNOSIS — N393 Stress incontinence (female) (male): Secondary | ICD-10-CM | POA: Diagnosis not present

## 2018-09-09 DIAGNOSIS — I10 Essential (primary) hypertension: Secondary | ICD-10-CM | POA: Diagnosis not present

## 2018-09-09 DIAGNOSIS — Z79899 Other long term (current) drug therapy: Secondary | ICD-10-CM | POA: Diagnosis not present

## 2018-09-09 DIAGNOSIS — Z01419 Encounter for gynecological examination (general) (routine) without abnormal findings: Secondary | ICD-10-CM | POA: Diagnosis not present

## 2018-09-09 DIAGNOSIS — Z124 Encounter for screening for malignant neoplasm of cervix: Secondary | ICD-10-CM | POA: Diagnosis not present

## 2018-09-09 DIAGNOSIS — Z833 Family history of diabetes mellitus: Secondary | ICD-10-CM | POA: Insufficient documentation

## 2018-09-09 DIAGNOSIS — R32 Unspecified urinary incontinence: Secondary | ICD-10-CM | POA: Diagnosis not present

## 2018-09-09 NOTE — Progress Notes (Signed)
67 y.o. G2P2002 Married Asian Not Hispanic or Latino female here for annual exam.  She has occasional urinary leakage with valsalva. No change in years. Not leaking unless she jumps, running. Normally she doesn't have trouble with frequency or urgency. No vaginal bulge.  Not sexually active, no vaginal bleeding. Normal bowel movements.    No LMP recorded. Patient is postmenopausal.          Sexually active: No.  The current method of family planning is post menopausal status.    Exercising: Yes.    walking,stretching, gym Smoker:  no  Health Maintenance: Pap: Unsure History of abnormal Pap:  no MMG:  01/02/2017 Birads 2 benign BMD:   03/13/2916 Normal Colonoscopy: 2018 normal, repeat in 10 years  TDaP:  12/26/2017 Gardasil: N/A   reports that she has never smoked. She has never used smokeless tobacco. She reports that she does not drink alcohol or use drugs. Not working. 2 children, 4 grandchildren.  Past Medical History:  Diagnosis Date  . Hyperlipemia    Dr. Pearson Grippe   . Hypertension   . Urinary incontinence     Past Surgical History:  Procedure Laterality Date  . CATARACT EXTRACTION N/A   . TUBAL LIGATION      Current Outpatient Medications  Medication Sig Dispense Refill  . amLODipine (NORVASC) 5 MG tablet TAKE 1 TABLET BY MOUTH EVERY DAY 90 tablet 0  . Ascorbic Acid (VITAMIN C) 1000 MG tablet Take 1,000 mg by mouth daily.    Marland Kitchen losartan (COZAAR) 100 MG tablet TAKE 1 TABLET BY MOUTH EVERY DAY 90 tablet 1  . Multiple Vitamin (MULTIVITAMIN) tablet Take 1 tablet by mouth daily.     No current facility-administered medications for this visit.     Family History  Problem Relation Age of Onset  . Diabetes Mother   . Hypertension Mother     Review of Systems  Constitutional: Negative.   HENT: Negative.   Eyes: Negative.   Respiratory: Negative.   Cardiovascular: Negative.   Gastrointestinal: Negative.   Endocrine: Negative.   Genitourinary:       Urinary  incontinence that began years ago with laughing and jumping  Musculoskeletal: Negative.   Skin: Negative.   Allergic/Immunologic: Negative.   Neurological: Negative.   Hematological: Negative.   Psychiatric/Behavioral: Negative.     Exam:   BP 124/88 (BP Location: Left Arm, Patient Position: Sitting, Cuff Size: Normal)   Pulse 72   Ht 5' 1.5" (1.562 m)   Wt 146 lb 9.6 oz (66.5 kg)   BMI 27.25 kg/m   Weight change: @WEIGHTCHANGE @ Height:   Height: 5' 1.5" (156.2 cm)  Ht Readings from Last 3 Encounters:  09/09/18 5' 1.5" (1.562 m)  06/23/18 5' 0.5" (1.537 m)  03/23/18 5' 0.5" (1.537 m)    General appearance: alert, cooperative and appears stated age Head: Normocephalic, without obvious abnormality, atraumatic Neck: no adenopathy, supple, symmetrical, trachea midline and thyroid normal to inspection and palpation Lungs: clear to auscultation bilaterally Cardiovascular: regular rate and rhythm Breasts: normal appearance, no masses or tenderness Abdomen: soft, non-tender; non distended,  no masses,  no organomegaly Extremities: extremities normal, atraumatic, no cyanosis or edema Skin: Skin color, texture, turgor normal. No rashes or lesions Lymph nodes: Cervical, supraclavicular, and axillary nodes normal. No abnormal inguinal nodes palpated Neurologic: Grossly normal   Pelvic: External genitalia:  no lesions              Urethra:  normal appearing urethra with no masses,  tenderness or lesions              Bartholins and Skenes: normal                 Vagina: normal appearing vagina with normal color and discharge, no lesions. Minimal cystocele with valsalva.               Cervix: no lesions               Bimanual Exam:  Uterus:  normal size, contour, position, consistency, mobility, non-tender and anteverted              Adnexa: no mass, fullness, tenderness               Rectovaginal: Confirms               Anus:  normal sphincter tone, no lesions  Chaperone was present  for exam.  A:  Well Woman with normal exam  GSI, she does kegels, interested in surgery  P:   Pap with hpv  Mammogram in 2/20  Colonoscopy and DEXA UTD  Discussed breast self exam  Discussed calcium and vit D intake   Labs with primary MD  Send urine for ua, c&s  Return for Urodynamic testing

## 2018-09-09 NOTE — Patient Instructions (Addendum)
Urinary Incontinence Urinary incontinence is the involuntary loss of urine from your bladder. What are the causes? There are many causes of urinary incontinence. They include:  Medicines.  Infections.  Prostatic enlargement, leading to overflow of urine from your bladder.  Surgery.  Neurological diseases.  Emotional factors.  What are the signs or symptoms? Urinary Incontinence can be divided into four types: 1. Urge incontinence. Urge incontinence is the involuntary loss of urine before you have the opportunity to go to the bathroom. There is a sudden urge to void but not enough time to reach a bathroom. 2. Stress incontinence. Stress incontinence is the sudden loss of urine with any activity that forces urine to pass. It is commonly caused by anatomical changes to the pelvis and sphincter areas of your body. 3. Overflow incontinence. Overflow incontinence is the loss of urine from an obstructed opening to your bladder. This results in a backup of urine and a resultant buildup of pressure within the bladder. When the pressure within the bladder exceeds the closing pressure of the sphincter, the urine overflows, which causes incontinence, similar to water overflowing a dam. 4. Total incontinence. Total incontinence is the loss of urine as a result of the inability to store urine within your bladder.  How is this diagnosed? Evaluating the cause of incontinence may require:  A thorough and complete medical and obstetric history.  A complete physical exam.  Laboratory tests such as a urine culture and sensitivities.  When additional tests are indicated, they can include:  An ultrasound exam.  Kidney and bladder X-rays.  Cystoscopy. This is an exam of the bladder using a narrow scope.  Urodynamic testing to test the nerve function to the bladder and sphincter areas.  How is this treated? Treatment for urinary incontinence depends on the cause:  For urge incontinence caused  by a bacterial infection, antibiotics will be prescribed. If the urge incontinence is related to medicines you take, your health care provider may have you change the medicine.  For stress incontinence, surgery to re-establish anatomical support to the bladder or sphincter, or both, will often correct the condition.  For overflow incontinence caused by an enlarged prostate, an operation to open the channel through the enlarged prostate will allow the flow of urine out of the bladder. In women with fibroids, a hysterectomy may be recommended.  For total incontinence, surgery on your urinary sphincter may help. An artificial urinary sphincter (an inflatable cuff placed around the urethra) may be required. In women who have developed a hole-like passage between their bladder and vagina (vesicovaginal fistula), surgery to close the fistula often is required.  Follow these instructions at home:  Normal daily hygiene and the use of pads or adult diapers that are changed regularly will help prevent odors and skin damage.  Avoid caffeine. It can overstimulate your bladder.  Use the bathroom regularly. Try about every 2-3 hours to go to the bathroom, even if you do not feel the need to do so. Take time to empty your bladder completely. After urinating, wait a minute. Then try to urinate again.  For causes involving nerve dysfunction, keep a log of the medicines you take and a journal of the times you go to the bathroom. Contact a health care provider if:  You experience worsening of pain instead of improvement in pain after your procedure.  Your incontinence becomes worse instead of better. Get help right away if:  You experience fever or shaking chills.  You are unable to   pass your urine.  You have redness spreading into your groin or down into your thighs. This information is not intended to replace advice given to you by your health care provider. Make sure you discuss any questions you have  with your health care provider. Document Released: 12/11/2004 Document Revised: 06/13/2016 Document Reviewed: 04/12/2013 Elsevier Interactive Patient Education  2018 [employer]. Kegel Exercises Kegel exercises help strengthen the muscles that support the rectum, vagina, small intestine, bladder, and uterus. Doing Kegel exercises can help:  Improve bladder and bowel control.  Improve sexual response.  Reduce problems and discomfort during pregnancy.  Kegel exercises involve squeezing your pelvic floor muscles, which are the same muscles you squeeze when you try to stop the flow of urine. The exercises can be done while sitting, standing, or lying down, but it is best to vary your position. Phase 1 exercises 5. Squeeze your pelvic floor muscles tight. You should feel a tight lift in your rectal area. If you are a female, you should also feel a tightness in your vaginal area. Keep your stomach, buttocks, and legs relaxed. 6. Hold the muscles tight for up to 10 seconds. 7. Relax your muscles. Repeat this exercise 50 times a day or as many times as told by your health care provider. Continue to do this exercise for at least 4-6 weeks or for as long as told by your health care provider. This information is not intended to replace advice given to you by your health care provider. Make sure you discuss any questions you have with your health care provider. Document Released: 10/20/2012 Document Revised: 06/28/2016 Document Reviewed: 09/23/2015 Elsevier Interactive Patient Education  2018 [employer].  Breast Self-Awareness Breast self-awareness means being familiar with how your breasts look and feel. It involves checking your breasts regularly and reporting any changes to your health care provider. Practicing breast self-awareness is important. A change in your breasts can be a sign of a serious medical problem. Being familiar with how your breasts look and feel allows you to find any problems  early, when treatment is more likely to be successful. All women should practice breast self-awareness, including women who have had breast implants. How to do a breast self-exam One way to learn what is normal for your breasts and whether your breasts are changing is to do a breast self-exam. To do a breast self-exam: Look for Changes  1. Remove all the clothing above your waist. 2. Stand in front of a mirror in a room with good lighting. 3. Put your hands on your hips. 4. Push your hands firmly downward. 5. Compare your breasts in the mirror. Look for differences between them (asymmetry), such as: ? Differences in shape. ? Differences in size. ? Puckers, dips, and bumps in one breast and not the other. 6. Look at each breast for changes in your skin, such as: ? Redness. ? Scaly areas. 7. Look for changes in your nipples, such as: ? Discharge. ? Bleeding. ? Dimpling. ? Redness. ? A change in position. Feel for Changes  Carefully feel your breasts for lumps and changes. It is best to do this while lying on your back on the floor and again while sitting or standing in the shower or tub with soapy water on your skin. Feel each breast in the following way:  Place the arm on the side of the breast you are examining above your head.  Feel your breast with the other hand.  Start in the nipple area  and make  inch (2 cm) overlapping circles to feel your breast. Use the pads of your three middle fingers to do this. Apply light pressure, then medium pressure, then firm pressure. The light pressure will allow you to feel the tissue closest to the skin. The medium pressure will allow you to feel the tissue that is a little deeper. The firm pressure will allow you to feel the tissue close to the ribs.  Continue the overlapping circles, moving downward over the breast until you feel your ribs below your breast.  Move one finger-width toward the center of the body. Continue to use the  inch (2  cm) overlapping circles to feel your breast as you move slowly up toward your collarbone.  Continue the up and down exam using all three pressures until you reach your armpit.  Write Down What You Find  Write down what is normal for each breast and any changes that you find. Keep a written record with breast changes or normal findings for each breast. By writing this information down, you do not need to depend only on memory for size, tenderness, or location. Write down where you are in your menstrual cycle, if you are still menstruating. If you are having trouble noticing differences in your breasts, do not get discouraged. With time you will become more familiar with the variations in your breasts and more comfortable with the exam. How often should I examine my breasts? Examine your breasts every month. If you are breastfeeding, the best time to examine your breasts is after a feeding or after using a breast pump. If you menstruate, the best time to examine your breasts is 5-7 days after your period is over. During your period, your breasts are lumpier, and it may be more difficult to notice changes. When should I see my health care provider? See your health care provider if you notice:  A change in shape or size of your breasts or nipples.  A change in the skin of your breast or nipples, such as a reddened or scaly area.  Unusual discharge from your nipples.  A lump or thick area that was not there before.  Pain in your breasts.  Anything that concerns you.  This information is not intended to replace advice given to you by your health care provider. Make sure you discuss any questions you have with your health care provider. Document Released: 11/03/2005 Document Revised: 04/10/2016 Document Reviewed: 09/23/2015 Elsevier Interactive Patient Education  2018 [employer].  EXERCISE AND DIET:  We recommended that you start or continue a regular exercise program for good health.  Regular exercise means any activity that makes your heart beat faster and makes you sweat.  We recommend exercising at least 30 minutes per day at least 3 days a week, preferably 4 or 5.  We also recommend a diet low in fat and sugar.  Inactivity, poor dietary choices and obesity can cause diabetes, heart attack, stroke, and kidney damage, among others.    ALCOHOL AND SMOKING:  Women should limit their alcohol intake to no more than 7 drinks/beers/glasses of wine (combined, not each!) per week. Moderation of alcohol intake to this level decreases your risk of breast cancer and liver damage. And of course, no recreational drugs are part of a healthy lifestyle.  And absolutely no smoking or even second hand smoke. Most people know smoking can cause heart and lung diseases, but did you know it also contributes to weakening of your bones? Aging  of your skin?  Yellowing of your teeth and nails?  CALCIUM AND VITAMIN D:  Adequate intake of calcium and Vitamin D are recommended.  The recommendations for exact amounts of these supplements seem to change often, but generally speaking 600 mg of calcium (either carbonate or citrate) and 800 units of Vitamin D per day seems prudent. Certain women may benefit from higher intake of Vitamin D.  If you are among these women, your doctor will have told you during your visit.    PAP SMEARS:  Pap smears, to check for cervical cancer or precancers,  have traditionally been done yearly, although recent scientific advances have shown that most women can have pap smears less often.  However, every woman still should have a physical exam from her gynecologist every year. It will include a breast check, inspection of the vulva and vagina to check for abnormal growths or skin changes, a visual exam of the cervix, and then an exam to evaluate the size and shape of the uterus and ovaries.  And after 68 years of age, a rectal exam is indicated to check for rectal cancers. We will also  provide age appropriate advice regarding health maintenance, like when you should have certain vaccines, screening for sexually transmitted diseases, bone density testing, colonoscopy, mammograms, etc.   MAMMOGRAMS:  All women over 12 years old should have a yearly mammogram. Many facilities now offer a "3D" mammogram, which may cost around $50 extra out of pocket. If possible,  we recommend you accept the option to have the 3D mammogram performed.  It both reduces the number of women who will be called back for extra views which then turn out to be normal, and it is better than the routine mammogram at detecting truly abnormal areas.    COLONOSCOPY:  Colonoscopy to screen for colon cancer is recommended for all women at age 42.  We know, you hate the idea of the prep.  We agree, BUT, having colon cancer and not knowing it is worse!!  Colon cancer so often starts as a polyp that can be seen and removed at colonscopy, which can quite literally save your life!  And if your first colonoscopy is normal and you have no family history of colon cancer, most women don't have to have it again for 10 years.  Once every ten years, you can do something that may end up saving your life, right?  We will be happy to help you get it scheduled when you are ready.  Be sure to check your insurance coverage so you understand how much it will cost.  It may be covered as a preventative service at no cost, but you should check your particular policy.

## 2018-09-10 LAB — URINALYSIS, MICROSCOPIC ONLY
Bacteria, UA: NONE SEEN
Casts: NONE SEEN /lpf

## 2018-09-10 LAB — URINE CULTURE

## 2018-09-13 LAB — CYTOLOGY - PAP
DIAGNOSIS: NEGATIVE
HPV (WINDOPATH): NOT DETECTED

## 2018-09-23 ENCOUNTER — Telehealth: Payer: Self-pay

## 2018-09-23 NOTE — Telephone Encounter (Signed)
Left message to call  at [phone].  Notes recorded by , Caroleen Hamman, RN on 09/14/2018 at 11:20 AM EDT Left message to call  at [phone]. ------  Notes recorded by Romualdo Bolk, MD on 09/13/2018 at 1:36 PM EDT Please call the patient and let her know that her urine culture was negative for infection, but she had RBC in her urine. She needs to return for a repeat ccua, if dip + for blood, then send for micro UA. You will need a [place] interpreter to speak with her.

## 2018-09-27 NOTE — Telephone Encounter (Signed)
Dr.Jertson, I have been unable to reach this patient x 2. Please advise.

## 2018-09-27 NOTE — Telephone Encounter (Signed)
Please send a letter

## 2018-09-27 NOTE — Progress Notes (Signed)
Kelli Mcintyre is a 67 y.o. female is here for follow up.  History of Present Illness:   HPI:   Essential hypertension Patient states that she has been taking everyday that she never stopped.  Patient has been keeping log of blood pressures she has not checked everyday but has several times a week. Her blood pressures have been in the 130's /80's.   Insulin resistance Exercising regularly and eating well. Lost five more pounds.she has started back at 1 tab twice a day and is working better. Hemoglobin A1c on 06/23/18 6.4.  Chronic bilateral low back pain with right-sided sciatica Improved. Going to [employer]. Sometimes stiff in the morning. Improved with Motrin.  Pure hypercholesterolemia Is the patient taking medications without problems? [x]   YES  []   NO Does the patient complain of muscle aches?   []   YES  [x]    NO Trying to exercise on a regular basis? [x]   YES  []   NO Compliant with diet? [x]   YES  []   NO   Lipids:    Component Value Date/Time   CHOL 212 (H) 09/29/2018 0935   TRIG 165.0 (H) 09/29/2018 0935   HDL 54.10 09/29/2018 0935   VLDL 33.0 09/29/2018 0935   CHOLHDL 4 09/29/2018 0935   Depression screen PHQ 2/9 12/24/2017 06/15/2017  Decreased Interest 0 0  Down, Depressed, Hopeless 0 0  PHQ - 2 Score 0 0   PMHx, SurgHx, SocialHx, FamHx, Medications, and Allergies were reviewed in the Visit Navigator and updated as appropriate.   Patient Active Problem List   Diagnosis Date Noted  . Insulin resistance 03/23/2018  . Osteopenia 03/23/2018  . Pure hypercholesterolemia 03/23/2018  . Essential hypertension 06/15/2017  . Chronic bilateral low back pain with right-sided sciatica, MRI with DDD, facet arthropathy, advanced spinal stenosis L4-L5 06/15/2017  . SI (sacroiliac) pain 06/15/2017   Social History   Tobacco Use  . Smoking status: Never Smoker  . Smokeless tobacco: Never Used  Substance Use Topics  . Alcohol use: No  . Drug use: No   Current Medications and  Allergies:   .  amLODipine (NORVASC) 5 MG tablet, TAKE 1 TABLET BY MOUTH EVERY DAY, Disp: 90 tablet, Rfl: 0 .  Ascorbic Acid (VITAMIN C) 1000 MG tablet, Take 1,000 mg by mouth daily., Disp: , Rfl:  .  losartan (COZAAR) 100 MG tablet, TAKE 1 TABLET BY MOUTH EVERY DAY, Disp: 90 tablet, Rfl: 1 .  Multiple Vitamin (MULTIVITAMIN) tablet, Take 1 tablet by mouth daily., Disp: , Rfl:   No Known Allergies   Review of Systems   Pertinent items are noted in the HPI. Otherwise, ROS is negative.  Vitals:   Vitals:   09/29/18 0850  BP: 130/83  Pulse: 88  Temp: 98.5 F (36.9 C)  TempSrc: Oral  SpO2: 96%  Weight: 145 lb (65.8 kg)  Height: 5' 1.5" (1.562 m)     Body mass index is 26.95 kg/m.  Physical Exam:   Physical Exam  Constitutional: She appears well-nourished.  HENT:  Head: Normocephalic and atraumatic.  Eyes: Pupils are equal, round, and reactive to light. EOM are normal.  Neck: Normal range of motion. Neck supple.  Cardiovascular: Normal rate, regular rhythm, normal heart sounds and intact distal pulses.  Pulmonary/Chest: Effort normal.  Abdominal: Soft.  Skin: Skin is warm.  Psychiatric: She has a normal mood and affect. Her behavior is normal.  Nursing note and vitals reviewed.  Assessment and Plan:   Pure hypercholesterolemia Trying to  exercise on a regular basis? Yes. Compliant with diet? Yes.  Lab Results  Component Value Date   CHOL 212 (H) 09/29/2018   HDL 54.10 09/29/2018   LDLCALC 125 (H) 09/29/2018   TRIG 165.0 (H) 09/29/2018   CHOLHDL 4 09/29/2018   Lab Results  Component Value Date   ALT 14 09/29/2018   AST 17 09/29/2018   ALKPHOS 40 09/29/2018   BILITOT 0.6 09/29/2018   Dyslipidemia under poor control.  Plan: 1. Continue dietary measures. 2. Continue regular exercise, an average 40 minutes of moderate to vigorous-intensity aerobic activity 3 or 4 times per week. 3. Lipid-lowering medications: Atorvastatin 10mg  QHS.  Insulin resistance Lab  Results  Component Value Date   HGBA1C 6.2 09/29/2018   Plan: 1. Kelli Mcintyre will continue to work on weight loss, exercise, and decreasing simple carbohydrates in her diet to help decrease the risk of diabetes.  2. Medication: Metformin.  Essential hypertension Review: taking medications as instructed, no medication side effects noted, no TIAs, no chest pain on exertion, no dyspnea on exertion, no swelling of ankles. Smoker: No..  Wt Readings from Last 3 Encounters:  09/29/18 145 lb (65.8 kg)  09/09/18 146 lb 9.6 oz (66.5 kg)  06/23/18 144 lb 3.2 oz (65.4 kg)   BP Readings from Last 3 Encounters:  09/29/18 130/83  09/09/18 124/88  06/23/18 132/84   Lab Results  Component Value Date   CREATININE 1.14 09/29/2018   Plan: 3. Medication: no change. 4. Dietary sodium restriction. 5. Regular aerobic exercise. 6. Check blood pressures weekly and record.  Orders Placed This Encounter  Procedures  . CBC with Differential/Platelet  . Comprehensive metabolic panel  . Hemoglobin A1c  . Lipid panel   Meds ordered this encounter  Medications  . metFORMIN (GLUCOPHAGE XR) 750 MG 24 hr tablet    Sig: Take 1 tablet (750 mg total) by mouth 2 (two) times daily.    Dispense:  60 tablet    Refill:  3  . atorvastatin (LIPITOR) 10 MG tablet    Sig: Take 1 tablet (10 mg total) by mouth daily.    Dispense:  90 tablet    Refill:  3   . Reviewed expectations re: course of current medical issues. . Discussed self-management of symptoms. . Outlined signs and symptoms indicating need for more acute intervention. . Patient verbalized understanding and all questions were answered. Marland Kitchen Health Maintenance issues including appropriate healthy diet, exercise, and smoking avoidance were discussed with patient. . See orders for this visit as documented in the electronic medical record. . Patient received an After Visit Summary.  Helane Rima, DO [employer], [place] 09/30/2018  CMA served as  Neurosurgeon during this visit. History, Physical, and Plan performed by medical provider. The above documentation has been reviewed and is accurate and complete. Helane Rima, D.O.

## 2018-09-28 ENCOUNTER — Telehealth: Payer: Medicare Other | Admitting: Nurse Practitioner

## 2018-09-28 DIAGNOSIS — M545 Low back pain, unspecified: Secondary | ICD-10-CM

## 2018-09-28 MED ORDER — NAPROXEN 500 MG PO TABS: 500.0000 mg | ORAL_TABLET | Freq: Two times a day (BID) | ORAL | 1 refills | Status: DC

## 2018-09-28 MED ORDER — CYCLOBENZAPRINE HCL 10 MG PO TABS: 10.0000 mg | ORAL_TABLET | Freq: Three times a day (TID) | ORAL | 1 refills | Status: DC | PRN

## 2018-09-28 NOTE — Telephone Encounter (Signed)
Letter mailed to patient's home address on file. Encounter closed. °

## 2018-09-28 NOTE — Progress Notes (Signed)

## 2018-09-28 NOTE — Telephone Encounter (Signed)
Letter to Dr.Jertson. 

## 2018-09-29 ENCOUNTER — Ambulatory Visit: Payer: Medicare Other | Admitting: Family Medicine

## 2018-09-29 ENCOUNTER — Encounter: Payer: Self-pay | Admitting: Family Medicine

## 2018-09-29 VITALS — BP 130/83 | HR 88 | Temp 98.5°F | Ht 61.5 in | Wt 145.0 lb

## 2018-09-29 DIAGNOSIS — G8929 Other chronic pain: Secondary | ICD-10-CM | POA: Diagnosis not present

## 2018-09-29 DIAGNOSIS — I10 Essential (primary) hypertension: Secondary | ICD-10-CM | POA: Diagnosis not present

## 2018-09-29 DIAGNOSIS — R7303 Prediabetes: Secondary | ICD-10-CM | POA: Diagnosis not present

## 2018-09-29 DIAGNOSIS — E7800 Pure hypercholesterolemia, unspecified: Secondary | ICD-10-CM

## 2018-09-29 DIAGNOSIS — M5441 Lumbago with sciatica, right side: Secondary | ICD-10-CM

## 2018-09-29 LAB — CBC WITH DIFFERENTIAL/PLATELET
Basophils Absolute: 0.1 10*3/uL (ref 0.0–0.1)
Basophils Relative: 0.9 % (ref 0.0–3.0)
Eosinophils Absolute: 0.1 10*3/uL (ref 0.0–0.7)
Eosinophils Relative: 1.9 % (ref 0.0–5.0)
HCT: 39.7 % (ref 36.0–46.0)
Hemoglobin: 13.4 g/dL (ref 12.0–15.0)
Lymphocytes Relative: 31.6 % (ref 12.0–46.0)
Lymphs Abs: 2.3 10*3/uL (ref 0.7–4.0)
MCHC: 33.8 g/dL (ref 30.0–36.0)
MCV: 86.7 fl (ref 78.0–100.0)
Monocytes Absolute: 0.5 10*3/uL (ref 0.1–1.0)
Monocytes Relative: 6.5 % (ref 3.0–12.0)
Neutro Abs: 4.3 10*3/uL (ref 1.4–7.7)
Neutrophils Relative %: 59.1 % (ref 43.0–77.0)
Platelets: 264 10*3/uL (ref 150.0–400.0)
RBC: 4.58 Mil/uL (ref 3.87–5.11)
RDW: 13.9 % (ref 11.5–15.5)
WBC: 7.2 10*3/uL (ref 4.0–10.5)

## 2018-09-29 LAB — LIPID PANEL
Cholesterol: 212 mg/dL — ABNORMAL HIGH (ref 0–200)
HDL: 54.1 mg/dL (ref >39.00)
LDL Cholesterol: 125 mg/dL — ABNORMAL HIGH (ref 0–99)
NonHDL: 157.91
Total CHOL/HDL Ratio: 4
Triglycerides: 165 mg/dL — ABNORMAL HIGH (ref 0.0–149.0)
VLDL: 33 mg/dL (ref 0.0–40.0)

## 2018-09-29 LAB — COMPREHENSIVE METABOLIC PANEL
ALT: 14 U/L (ref 0–35)
AST: 17 U/L (ref 0–37)
Albumin: 4.4 g/dL (ref 3.5–5.2)
Alkaline Phosphatase: 40 U/L (ref 39–117)
BUN: 17 mg/dL (ref 6–23)
CO2: 30 mEq/L (ref 19–32)
Calcium: 9.9 mg/dL (ref 8.4–10.5)
Chloride: 103 mEq/L (ref 96–112)
Creatinine, Ser: 1.14 mg/dL (ref 0.40–1.20)
GFR: 50.3 mL/min — ABNORMAL LOW (ref >60.00)
Glucose, Bld: 98 mg/dL (ref 70–99)
Potassium: 4.5 mEq/L (ref 3.5–5.1)
Sodium: 138 mEq/L (ref 135–145)
Total Bilirubin: 0.6 mg/dL (ref 0.2–1.2)
Total Protein: 7.6 g/dL (ref 6.0–8.3)

## 2018-09-29 LAB — HEMOGLOBIN A1C: Hgb A1c MFr Bld: 6.2 % (ref 4.6–6.5)

## 2018-09-29 MED ORDER — METFORMIN HCL ER 750 MG PO TB24: 750.0000 mg | ORAL_TABLET | Freq: Two times a day (BID) | ORAL | 3 refills | Status: DC

## 2018-09-30 ENCOUNTER — Encounter: Payer: Self-pay | Admitting: Family Medicine

## 2018-09-30 MED ORDER — ATORVASTATIN CALCIUM 10 MG PO TABS: 10.0000 mg | ORAL_TABLET | Freq: Every day | ORAL | 3 refills | Status: DC

## 2018-09-30 NOTE — Assessment & Plan Note (Signed)
Lab Results  Component Value Date   HGBA1C 6.2 09/29/2018   Plan: 1. Kelli Mcintyre will continue to work on weight loss, exercise, and decreasing simple carbohydrates in her diet to help decrease the risk of diabetes.  2. Medication: Metformin.

## 2018-09-30 NOTE — Assessment & Plan Note (Signed)
Trying to exercise on a regular basis? Yes. Compliant with diet? Yes.  Lab Results  Component Value Date   CHOL 212 (H) 09/29/2018   HDL 54.10 09/29/2018   LDLCALC 125 (H) 09/29/2018   TRIG 165.0 (H) 09/29/2018   CHOLHDL 4 09/29/2018   Lab Results  Component Value Date   ALT 14 09/29/2018   AST 17 09/29/2018   ALKPHOS 40 09/29/2018   BILITOT 0.6 09/29/2018   Dyslipidemia under poor control.  Plan: 1. Continue dietary measures. 2. Continue regular exercise, an average 40 minutes of moderate to vigorous-intensity aerobic activity 3 or 4 times per week. 3. Lipid-lowering medications: Atorvastatin 10mg  QHS.

## 2018-09-30 NOTE — Assessment & Plan Note (Signed)
Review: taking medications as instructed, no medication side effects noted, no TIAs, no chest pain on exertion, no dyspnea on exertion, no swelling of ankles. Smoker: No..  Wt Readings from Last 3 Encounters:  09/29/18 145 lb (65.8 kg)  09/09/18 146 lb 9.6 oz (66.5 kg)  06/23/18 144 lb 3.2 oz (65.4 kg)   BP Readings from Last 3 Encounters:  09/29/18 130/83  09/09/18 124/88  06/23/18 132/84   Lab Results  Component Value Date   CREATININE 1.14 09/29/2018   Plan: 1. Medication: no change. 2. Dietary sodium restriction. 3. Regular aerobic exercise. 4. Check blood pressures weekly and record.

## 2018-10-01 NOTE — Telephone Encounter (Signed)
I have added information to results note.

## 2018-10-11 DIAGNOSIS — Z961 Presence of intraocular lens: Secondary | ICD-10-CM | POA: Diagnosis not present

## 2018-10-11 DIAGNOSIS — H4321 Crystalline deposits in vitreous body, right eye: Secondary | ICD-10-CM | POA: Diagnosis not present

## 2018-10-11 DIAGNOSIS — E119 Type 2 diabetes mellitus without complications: Secondary | ICD-10-CM | POA: Diagnosis not present

## 2018-10-11 LAB — HM DIABETES EYE EXAM

## 2018-10-12 ENCOUNTER — Encounter: Payer: Self-pay | Admitting: Family Medicine

## 2018-11-04 ENCOUNTER — Encounter: Payer: Self-pay | Admitting: Family Medicine

## 2018-11-26 ENCOUNTER — Encounter: Payer: Self-pay | Admitting: Family Medicine

## 2018-11-26 ENCOUNTER — Ambulatory Visit (INDEPENDENT_AMBULATORY_CARE_PROVIDER_SITE_OTHER): Payer: Medicare Other | Admitting: Family Medicine

## 2018-11-26 ENCOUNTER — Ambulatory Visit (INDEPENDENT_AMBULATORY_CARE_PROVIDER_SITE_OTHER): Payer: Medicare Other

## 2018-11-26 VITALS — BP 136/82 | HR 74 | Temp 98.3°F | Ht 62.0 in | Wt 145.0 lb

## 2018-11-26 DIAGNOSIS — E7800 Pure hypercholesterolemia, unspecified: Secondary | ICD-10-CM

## 2018-11-26 DIAGNOSIS — R079 Chest pain, unspecified: Secondary | ICD-10-CM | POA: Diagnosis not present

## 2018-11-26 DIAGNOSIS — E88819 Insulin resistance, unspecified: Secondary | ICD-10-CM

## 2018-11-26 DIAGNOSIS — E8881 Metabolic syndrome: Secondary | ICD-10-CM | POA: Diagnosis not present

## 2018-11-26 DIAGNOSIS — I10 Essential (primary) hypertension: Secondary | ICD-10-CM | POA: Diagnosis not present

## 2018-11-26 DIAGNOSIS — R072 Precordial pain: Secondary | ICD-10-CM | POA: Diagnosis not present

## 2018-11-26 NOTE — Progress Notes (Signed)
Kelli Mcintyre is a 67 y.o. female is here for follow up.  History of Present Illness:   HPI: Patient with complaints of chest pain, anterior, squeezing, lasting a few minutes, associated with nausea. Episodic. Started a few months ago. Hx of DM, HTN, HLD. Stopped statin due to fatigue and hand pain. Strong family history of CAD - all siblings take prn Nitroglycerine in [place]. She is the baby of the family. ASCVD risk score > 10. Denies reflux symptoms, chest wall pain, fever, cough, edema.   There are no preventive care reminders to display for this patient.   Depression screen [facility] 2/9 11/26/2018 12/24/2017 06/15/2017  Decreased Interest 1 0 0  Down, Depressed, Hopeless 1 0 0  PHQ - 2 Score 2 0 0  Altered sleeping 2 - -  Tired, decreased energy 2 - -  Change in appetite 1 - -  Feeling bad or failure about yourself  1 - -  Trouble concentrating 1 - -  Moving slowly or fidgety/restless 0 - -  Suicidal thoughts 0 - -  PHQ-9 Score 9 - -  Difficult doing work/chores Somewhat difficult - -   PMHx, SurgHx, SocialHx, FamHx, Medications, and Allergies were reviewed in the Visit Navigator and updated as appropriate.   Patient Active Problem List   Diagnosis Date Noted  . Insulin resistance 03/23/2018  . Osteopenia 03/23/2018  . Pure hypercholesterolemia 03/23/2018  . Essential hypertension 06/15/2017  . Chronic bilateral low back pain with right-sided sciatica, MRI with DDD, facet arthropathy, advanced spinal stenosis L4-L5 06/15/2017  . SI (sacroiliac) pain 06/15/2017   Social History   Tobacco Use  . Smoking status: Never Smoker  . Smokeless tobacco: Never Used  Substance Use Topics  . Alcohol use: No  . Drug use: No   Current Medications and Allergies:   .  amLODipine (NORVASC) 5 MG tablet, TAKE 1 TABLET BY MOUTH EVERY DAY, Disp: 90 tablet, Rfl: 0 .  Ascorbic Acid (VITAMIN C) 1000 MG tablet, Take 1,000 mg by mouth daily., Disp: , Rfl:  .  atorvastatin (LIPITOR) 10 MG tablet,  Take 1 tablet (10 mg total) by mouth daily., Disp: 90 tablet, Rfl: 3 .  losartan (COZAAR) 100 MG tablet, TAKE 1 TABLET BY MOUTH EVERY DAY, Disp: 90 tablet, Rfl: 1 .  metFORMIN (GLUCOPHAGE XR) 750 MG 24 hr tablet, Take 1 tablet (750 mg total) by mouth 2 (two) times daily., Disp: 60 tablet, Rfl: 3 .  Multiple Vitamin (MULTIVITAMIN) tablet, Take 1 tablet by mouth daily., Disp: , Rfl:    Allergies  Allergen Reactions  . Lipitor [Atorvastatin Calcium] Other (See Comments)    Muscle pain      Review of Systems   Pertinent items are noted in the HPI. Otherwise, a complete ROS is negative.  Vitals:   Vitals:   11/26/18 0851  BP: 136/82  Pulse: 74  Temp: 98.3 F (36.8 C)  TempSrc: Oral  SpO2: 97%  Weight: 145 lb (65.8 kg)  Height: 5\' 2"  (1.575 m)     Body mass index is 26.52 kg/m.  Physical Exam:   Physical Exam Vitals signs and nursing note reviewed.  HENT:     Head: Normocephalic and atraumatic.  Eyes:     Pupils: Pupils are equal, round, and reactive to light.  Neck:     Musculoskeletal: Normal range of motion and neck supple.  Cardiovascular:     Rate and Rhythm: Normal rate and regular rhythm.     Heart sounds:  Normal heart sounds.  Pulmonary:     Effort: Pulmonary effort is normal.  Abdominal:     Palpations: Abdomen is soft.  Skin:    General: Skin is warm.  Psychiatric:        Behavior: Behavior normal.    EKG: normal sinus rhythm.   Assessment and Plan:   Kelli Mcintyre was seen today for follow-up.  Diagnoses and all orders for this visit:  Precordial pain Comments: No pain now. With reassuring work-up. ASCVD > 10. Stopped statin on her own. To [facility] for evaluation.  Orders: -     EKG 12-Lead -     DG Chest 2 View; Future -     Ambulatory referral to Cardiology  Insulin resistance  Pure hypercholesterolemia  Essential hypertension   . Orders and follow up as documented in EpicCare, reviewed diet, exercise and weight control, cardiovascular  risk and specific lipid/LDL goals reviewed, reviewed medications and side effects in detail.  . Reviewed expectations re: course of current medical issues. . Outlined signs and symptoms indicating need for more acute intervention. . Patient verbalized understanding and all questions were answered. . Patient received an After Visit Summary.  Helane Rima, DO [place], [facility] 11/26/2018

## 2018-11-29 ENCOUNTER — Other Ambulatory Visit: Payer: Self-pay | Admitting: Family Medicine

## 2018-11-29 DIAGNOSIS — I10 Essential (primary) hypertension: Secondary | ICD-10-CM

## 2018-12-07 ENCOUNTER — Ambulatory Visit: Payer: Medicare Other | Admitting: Cardiovascular Disease

## 2018-12-07 ENCOUNTER — Encounter: Payer: Self-pay | Admitting: Cardiovascular Disease

## 2018-12-07 ENCOUNTER — Encounter

## 2018-12-07 VITALS — BP 136/82 | HR 84 | Ht 60.0 in | Wt 146.4 lb

## 2018-12-07 DIAGNOSIS — E785 Hyperlipidemia, unspecified: Secondary | ICD-10-CM | POA: Insufficient documentation

## 2018-12-07 DIAGNOSIS — R079 Chest pain, unspecified: Secondary | ICD-10-CM

## 2018-12-07 DIAGNOSIS — E782 Mixed hyperlipidemia: Secondary | ICD-10-CM | POA: Insufficient documentation

## 2018-12-07 MED ORDER — METOPROLOL TARTRATE 100 MG PO TABS: 100.0000 mg | ORAL_TABLET | Freq: Once | ORAL | 0 refills | Status: DC

## 2018-12-07 NOTE — Assessment & Plan Note (Signed)
Kelli Mcintyre was referred to me by Dr. Earlene Plater for evaluation of episodic chest pain occurring several times a year.  She does have positive risk factors.  The pain occurs randomly, lasts up to 5 minutes at a time without radiation.  I am going get a coronary CTA to further evaluate.

## 2018-12-07 NOTE — Progress Notes (Signed)
time without provocation or radiation.    12/07/2018 Kelli BenderHyoun J Koerner   02/19/1951  [id number]  Primary Physician Helane RimaWallace, Erica, DO Primary Cardiologist: Runell GessJonathan J  MD Nicholes CalamityFACP, FACC, FAHA, [place]  HPI:  Kelli Mcintyre is a 68 y.o. moderately overweight married [place] female mother of 2 children grandmother of 4 grandchildren referred by Dr. Helane RimaErica Wallace for cardiovascular valuation because of chest pain.  She does have a history of treated hypertension, diabetes and untreated hyperlipidemia.  She has siblings without chest pain but no documented heart disease.  She is never had heart attack or stroke.  She is had chest pain evaluated 15 years ago with negative work-up.  The pain occurs several times a year lasting up to 5 minutes at a   Current Meds  Medication Sig  . amLODipine (NORVASC) 5 MG tablet TAKE 1 TABLET BY MOUTH EVERY DAY  . Ascorbic Acid (VITAMIN C) 1000 MG tablet Take 1,000 mg by mouth daily.  Marland Kitchen. losartan (COZAAR) 100 MG tablet TAKE 1 TABLET BY MOUTH EVERY DAY  . metFORMIN (GLUCOPHAGE XR) 750 MG 24 hr tablet Take 1 tablet (750 mg total) by mouth 2 (two) times daily.  . Multiple Vitamin (MULTIVITAMIN) tablet Take 1 tablet by mouth daily.     Allergies  Allergen Reactions  . Lipitor [Atorvastatin Calcium] Other (See Comments)    Muscle pain     Social History   Socioeconomic History  . Marital status: Married    Spouse name: Not on file  . Number of children: Not on file  . Years of education: Not on file  . Highest education level: Not on file  Occupational History  . Not on file  Social Needs  . Financial resource strain: Not on file  . Food insecurity:    Worry: Not on file    Inability: Not on file  . Transportation needs:    Medical: Not on file    Non-medical: Not on file  Tobacco Use  . Smoking status: Never Smoker  . Smokeless tobacco: Never Used  Substance and Sexual Activity  . Alcohol use: No  . Drug use: No  . Sexual activity: Not Currently      Birth control/protection: Surgical    Comment: tubal ligation  Lifestyle  . Physical activity:    Days per week: Not on file    Minutes per session: Not on file  . Stress: Not on file  Relationships  . Social connections:    Talks on phone: Not on file    Gets together: Not on file    Attends religious service: Not on file    Active member of club or organization: Not on file    Attends meetings of clubs or organizations: Not on file    Relationship status: Not on file  . Intimate partner violence:    Fear of current or ex partner: Not on file    Emotionally abused: Not on file    Physically abused: Not on file    Forced sexual activity: Not on file  Other Topics Concern  . Not on file  Social History Narrative  . Not on file     Review of Systems: General: negative for chills, fever, night sweats or weight changes.  Cardiovascular: negative for chest pain, dyspnea on exertion, edema, orthopnea, palpitations, paroxysmal nocturnal dyspnea or shortness of breath Dermatological: negative for rash Respiratory: negative for cough or wheezing Urologic: negative for hematuria Abdominal: negative for nausea, vomiting, diarrhea, bright red  blood per rectum, melena, or hematemesis Neurologic: negative for visual changes, syncope, or dizziness All other systems reviewed and are otherwise negative except as noted above.    Blood pressure 136/82, pulse 84, height 5' (1.524 m), weight 146 lb 6.4 oz (66.4 kg).  General appearance: alert and no distress Neck: no adenopathy, no carotid bruit, no JVD, supple, symmetrical, trachea midline and thyroid not enlarged, symmetric, no tenderness/mass/nodules Lungs: clear to auscultation bilaterally Heart: regular rate and rhythm, S1, S2 normal, no murmur, click, rub or gallop Extremities: extremities normal, atraumatic, no cyanosis or edema Pulses: 2+ and symmetric Skin: Skin color, texture, turgor normal. No rashes or lesions Neurologic:  Alert and oriented X 3, normal strength and tone. Normal symmetric reflexes. Normal coordination and gait  EKG not performed today  ASSESSMENT AND PLAN:   Chest pain Ms. Klepinger was referred to me by Dr. Earlene Plater for evaluation of episodic chest pain occurring several times a year.  She does have positive risk factors.  The pain occurs randomly, lasts up to 5 minutes at a time without radiation.  I am going get a coronary CTA to further evaluate.  Essential hypertension History of essential hypertension with blood pressure measured today 136/82.  She is on amlodipine and losartan.  Continue current meds at current dosing.  Hyperlipidemia History of hyperlipidemia not on statin therapy with lipid profile performed 09/29/2018 revealing total cholesterol 212, LDL of 125 and HDL 54 followed by her PCP.      Runell Gess MD FACP,FACC,FAHA, [facility] 12/07/2018 2:05 PM

## 2018-12-07 NOTE — Assessment & Plan Note (Signed)
History of essential hypertension with blood pressure measured today 136/82.  She is on amlodipine and losartan.  Continue current meds at current dosing.

## 2018-12-07 NOTE — Assessment & Plan Note (Signed)
History of hyperlipidemia not on statin therapy with lipid profile performed 09/29/2018 revealing total cholesterol 212, LDL of 125 and HDL 54 followed by her PCP.

## 2018-12-07 NOTE — Patient Instructions (Addendum)
Please arrive at the [facility] main entrance of [facility] at xx:xx AM (30-45 minutes prior to test start time)  [facility] [address] [place], [place] [address] [phone]  Proceed to the [facility] Radiology Department (First Floor).  Please follow these instructions carefully (unless otherwise directed):  Hold all erectile dysfunction medications at least 48 hours prior to test.  On the Night Before the Test: . Be sure to Drink plenty of water. . Do not consume any caffeinated/decaffeinated beverages or chocolate 12 hours prior to your test. . Do not take any antihistamines 12 hours prior to your test. . If you take Metformin do not take 24 hours prior to test. . If the patient has contrast allergy: ? Patient will need a prescription for Prednisone and very clear instructions (as follows): 1. Prednisone 50 mg - take 13 hours prior to test 2. Take another Prednisone 50 mg 7 hours prior to test 3. Take another Prednisone 50 mg 1 hour prior to test 4. Take Benadryl 50 mg 1 hour prior to test . Patient must complete all four doses of above prophylactic medications. . Patient will need a ride after test due to Benadryl.  On the Day of the Test: . Drink plenty of water. Do not drink any water within one hour of the test. . Do not eat any food 4 hours prior to the test. . You may take your regular medications prior to the test.  . Take metoprolol (Lopressor) two hours prior to test.   *For Clinical Staff only. Please instruct patient the following:*        -Drink plenty of water           -Take metoprolol 100MG  (Lopressor) 2 hours prior to test (if applicable).                                 -IF HR is greater than 55 BPM and patient is less than or equal to 35 yrs old Lopressor 100mg  x1.                   After the Test: . Drink plenty of water. . After receiving IV contrast, you may experience a mild flushed feeling. This is normal. . On  occasion, you may experience a mild rash up to 24 hours after the test. This is not dangerous. If this occurs, you can take Benadryl 25 mg and increase your fluid intake. . If you experience trouble breathing, this can be serious. If it is severe call 911 IMMEDIATELY. If it is mild, please call our office. . If you take any of these medications: Glipizide/Metformin, Avandament, Glucavance, please do not take 48 hours after completing test.   FOLLOW UP: A FOLLOW UP APPOINTMENT MAY BE SCHEDULED IF YOUR RESULTS AR ABNORMAL

## 2018-12-08 LAB — CBC
Hematocrit: 39.4 % (ref 34.0–46.6)
Hemoglobin: 13.7 g/dL (ref 11.1–15.9)
MCH: 29.6 pg (ref 26.6–33.0)
MCHC: 34.8 g/dL (ref 31.5–35.7)
MCV: 85 fL (ref 79–97)
Platelets: 259 10*3/uL (ref 150–450)
RBC: 4.63 x10E6/uL (ref 3.77–5.28)
RDW: 13.6 % (ref 11.7–15.4)
WBC: 7.1 10*3/uL (ref 3.4–10.8)

## 2018-12-08 LAB — BASIC METABOLIC PANEL
BUN / CREAT RATIO: 16 (ref 12–28)
BUN: 15 mg/dL (ref 8–27)
CO2: 26 mmol/L (ref 20–29)
Calcium: 10.3 mg/dL (ref 8.7–10.3)
Chloride: 99 mmol/L (ref 96–106)
Creatinine, Ser: 0.93 mg/dL (ref 0.57–1.00)
GFR calc Af Amer: 73 mL/min/{1.73_m2} (ref >59)
GFR calc non Af Amer: 63 mL/min/{1.73_m2} (ref >59)
Glucose: 117 mg/dL — ABNORMAL HIGH (ref 65–99)
POTASSIUM: 4.7 mmol/L (ref 3.5–5.2)
SODIUM: 140 mmol/L (ref 134–144)

## 2018-12-14 ENCOUNTER — Other Ambulatory Visit: Payer: Self-pay | Admitting: Family Medicine

## 2018-12-14 DIAGNOSIS — I10 Essential (primary) hypertension: Secondary | ICD-10-CM

## 2018-12-22 ENCOUNTER — Ambulatory Visit (HOSPITAL_COMMUNITY)
Admission: RE | Admit: 2018-12-22 | Discharge: 2018-12-22 | Disposition: A | Discharge status: Home / Self Care | Payer: Medicare Other | Source: Ambulatory Visit | Attending: Cardiovascular Disease | Admitting: Cardiovascular Disease

## 2018-12-22 ENCOUNTER — Other Ambulatory Visit: Payer: Self-pay | Admitting: Family Medicine

## 2018-12-22 DIAGNOSIS — R7303 Prediabetes: Secondary | ICD-10-CM

## 2018-12-22 DIAGNOSIS — I700 Atherosclerosis of aorta: Secondary | ICD-10-CM | POA: Diagnosis not present

## 2018-12-22 DIAGNOSIS — R079 Chest pain, unspecified: Secondary | ICD-10-CM | POA: Insufficient documentation

## 2018-12-22 DIAGNOSIS — E7800 Pure hypercholesterolemia, unspecified: Secondary | ICD-10-CM

## 2018-12-22 DIAGNOSIS — I10 Essential (primary) hypertension: Secondary | ICD-10-CM

## 2018-12-22 MED ORDER — NITROGLYCERIN 0.4 MG SL SUBL
0.8000 mg | SUBLINGUAL_TABLET | Freq: Once | SUBLINGUAL | Status: AC
  Administered 2018-12-22: 0.8 mg via SUBLINGUAL

## 2018-12-22 MED ORDER — NITROGLYCERIN 0.4 MG SL SUBL
SUBLINGUAL_TABLET | SUBLINGUAL | Status: AC
  Filled 2018-12-22: qty 2

## 2018-12-22 MED ORDER — IOPAMIDOL (ISOVUE-370) INJECTION 76%
80.0000 mL | Freq: Once | INTRAVENOUS | Status: AC | PRN
  Administered 2018-12-22: 80 mL via INTRAVENOUS

## 2019-01-04 ENCOUNTER — Encounter: Payer: Self-pay | Admitting: Family Medicine

## 2019-01-14 DIAGNOSIS — Z1231 Encounter for screening mammogram for malignant neoplasm of breast: Secondary | ICD-10-CM | POA: Diagnosis not present

## 2019-01-14 LAB — HM MAMMOGRAPHY

## 2019-01-18 ENCOUNTER — Encounter: Payer: Self-pay | Admitting: Family Medicine

## 2019-02-21 ENCOUNTER — Other Ambulatory Visit: Payer: Self-pay | Admitting: Family Medicine

## 2019-02-21 DIAGNOSIS — I10 Essential (primary) hypertension: Secondary | ICD-10-CM

## 2019-03-22 ENCOUNTER — Encounter: Payer: Self-pay | Admitting: Family Medicine

## 2019-03-30 ENCOUNTER — Ambulatory Visit: Payer: Medicare Other | Admitting: Family Medicine

## 2019-05-03 ENCOUNTER — Encounter: Payer: Self-pay | Admitting: Family Medicine

## 2019-05-09 ENCOUNTER — Other Ambulatory Visit: Payer: Self-pay

## 2019-05-09 ENCOUNTER — Encounter: Payer: Self-pay | Admitting: Family Medicine

## 2019-05-09 ENCOUNTER — Ambulatory Visit (INDEPENDENT_AMBULATORY_CARE_PROVIDER_SITE_OTHER): Payer: Medicare Other | Admitting: Family Medicine

## 2019-05-09 VITALS — BP 126/80 | HR 85 | Temp 97.7°F | Ht 60.0 in | Wt 143.0 lb

## 2019-05-09 DIAGNOSIS — E8881 Metabolic syndrome: Secondary | ICD-10-CM

## 2019-05-09 DIAGNOSIS — I10 Essential (primary) hypertension: Secondary | ICD-10-CM

## 2019-05-09 DIAGNOSIS — Z789 Other specified health status: Secondary | ICD-10-CM

## 2019-05-09 DIAGNOSIS — E663 Overweight: Secondary | ICD-10-CM

## 2019-05-09 DIAGNOSIS — E7800 Pure hypercholesterolemia, unspecified: Secondary | ICD-10-CM | POA: Diagnosis not present

## 2019-05-09 DIAGNOSIS — E88819 Insulin resistance, unspecified: Secondary | ICD-10-CM

## 2019-05-09 DIAGNOSIS — Z9189 Other specified personal risk factors, not elsewhere classified: Secondary | ICD-10-CM | POA: Diagnosis not present

## 2019-05-09 MED ORDER — METFORMIN HCL 850 MG PO TABS: 850.0000 mg | ORAL_TABLET | Freq: Two times a day (BID) | ORAL | 3 refills | Status: DC

## 2019-05-09 NOTE — Progress Notes (Signed)
Virtual Visit via Video   Due to the COVID-19 pandemic, this visit was completed with telemedicine (audio/video) technology to reduce patient and provider exposure as well as to preserve personal protective equipment.   I connected with Kelli Mcintyre by a video enabled telemedicine application and verified that I am speaking with the correct person using two identifiers. Location patient: Home Location provider: [facility], Office Persons participating in the virtual visit: Arwyn J Mifflin, Briscoe Deutscher, DO   I discussed the limitations of evaluation and management by telemedicine and the availability of in person appointments. The patient expressed understanding and agreed to proceed.  Care Team   Patient Care Team: Briscoe Deutscher, DO as PCP - General (Family Medicine)  Subjective:   HPI:   1. Insulin resistance   2. Pure hypercholesterolemia   3. Essential hypertension   4. Overweight (BMI 25.0-29.9)   5. Cardiovascular event risk 11.3%   6. Statin intolerance    Lab Results  Component Value Date   HGBA1C 6.2 09/29/2018   HGBA1C 6.4 06/23/2018   HGBA1C 6.3 12/23/2017   Lab Results  Component Value Date   LDLCALC 125 (H) 09/29/2018   CREATININE 0.93 12/07/2018   BP Readings from Last 3 Encounters:  05/09/19 126/80  12/22/18 103/81  12/07/18 136/82   Wt Readings from Last 3 Encounters:  05/09/19 143 lb (64.9 kg)  12/07/18 146 lb 6.4 oz (66.4 kg)  11/26/18 145 lb (65.8 kg)   Review of Systems  Constitutional: Negative for chills, fever, malaise/fatigue and weight loss.  Respiratory: Negative for cough, shortness of breath and wheezing.   Cardiovascular: Negative for chest pain, palpitations and leg swelling.  Gastrointestinal: Negative for abdominal pain, constipation, diarrhea, nausea and vomiting.  Genitourinary: Negative for dysuria and urgency.  Musculoskeletal: Negative for joint pain and myalgias.  Skin: Negative for rash.  Neurological: Negative for  dizziness and headaches.  Psychiatric/Behavioral: Negative for depression, substance abuse and suicidal ideas. The patient is not nervous/anxious.     Patient Active Problem List   Diagnosis Date Noted  . Cardiovascular event risk 11.3% 05/10/2019  . Overweight (BMI 25.0-29.9) 05/10/2019  . Statin intolerance 05/10/2019  . Hyperlipidemia, refuses statin 12/07/2018  . Insulin resistance 03/23/2018  . Osteopenia 03/23/2018  . Pure hypercholesterolemia 03/23/2018  . Essential hypertension 06/15/2017  . Chronic bilateral low back pain with right-sided sciatica, MRI with DDD, facet arthropathy, advanced spinal stenosis L4-L5 06/15/2017  . SI (sacroiliac) pain 06/15/2017    Social History   Tobacco Use  . Smoking status: Never Smoker  . Smokeless tobacco: Never Used  Substance Use Topics  . Alcohol use: No    Current Outpatient Medications:  .  amLODipine (NORVASC) 5 MG tablet, Take 1 tablet (5 mg total) by mouth daily., Disp: 90 tablet, Rfl: 1 .  Ascorbic Acid (VITAMIN C) 1000 MG tablet, Take 1,000 mg by mouth daily., Disp: , Rfl:  .  losartan (COZAAR) 100 MG tablet, Take 1 tablet (100 mg total) by mouth daily., Disp: 90 tablet, Rfl: 1 .  Multiple Vitamin (MULTIVITAMIN) tablet, Take 1 tablet by mouth daily., Disp: , Rfl:  .  metFORMIN (GLUCOPHAGE) 850 MG tablet, Take 1 tablet (850 mg total) by mouth 2 (two) times daily with a meal., Disp: 60 tablet, Rfl: 3  Allergies  Allergen Reactions  . Lipitor [Atorvastatin Calcium] Other (See Comments)    Muscle pain     Objective:   VITALS: Per patient if applicable, see vitals. GENERAL: Alert, appears well  and in no acute distress. HEENT: Atraumatic, conjunctiva clear, no obvious abnormalities on inspection of external nose and ears. NECK: Normal movements of the head and neck. CARDIOPULMONARY: No increased WOB. Speaking in clear sentences. I:E ratio WNL.  MS: Moves all visible extremities without noticeable abnormality. PSYCH:  Pleasant and cooperative, well-groomed. Speech normal rate and rhythm. Affect is appropriate. Insight and judgement are appropriate. Attention is focused, linear, and appropriate.  NEURO: CN grossly intact. Oriented as arrived to appointment on time with no prompting. Moves both UE equally.  SKIN: No obvious lesions, wounds, erythema, or cyanosis noted on face or hands.  [facility] 11/26/2018 12/24/2017 06/15/2017  Decreased Interest 1 0 0  Down, Depressed, Hopeless 1 0 0  PHQ - 2 Score 2 0 0  Altered sleeping 2 - -  Tired, decreased energy 2 - -  Change in appetite 1 - -  Feeling bad or failure about yourself  1 - -  Trouble concentrating 1 - -  Moving slowly or fidgety/restless 0 - -  Suicidal thoughts 0 - -  PHQ-9 Score 9 - -  Difficult doing work/chores Somewhat difficult - -    Assessment and Plan:   Kelli Mcintyre was seen today for insulin resistance.  Diagnoses and all orders for this visit:  Insulin resistance -     metFORMIN (GLUCOPHAGE) 850 MG tablet; Take 1 tablet (850 mg total) by mouth 2 (two) times daily with a meal.  Pure hypercholesterolemia  Essential hypertension -     losartan (COZAAR) 100 MG tablet; Take 1 tablet (100 mg total) by mouth daily. -     amLODipine (NORVASC) 5 MG tablet; Take 1 tablet (5 mg total) by mouth daily.  Overweight (BMI 25.0-29.9)  Cardiovascular event risk 11.3%  Statin intolerance   . COVID-19 Education: The signs and symptoms of COVID-19 were discussed with the patient and how to seek care for testing if needed. The importance of social distancing was discussed today. . Reviewed expectations re: course of current medical issues. . Discussed self-management of symptoms. . Outlined signs and symptoms indicating need for more acute intervention. . Patient verbalized understanding and all questions were answered. Marland Kitchen. Health Maintenance issues including appropriate healthy diet, exercise, and smoking avoidance were discussed with  patient. . See orders for this visit as documented in the electronic medical record.  Helane RimaErica Kieren Ricci, DO  Records requested if needed. Time spent: 25 minutes, of which >50% was spent in obtaining information about her symptoms, reviewing her previous labs, evaluations, and treatments, counseling her about her condition (please see the discussed topics above), and developing a plan to further investigate it; she had a number of questions which I addressed.

## 2019-05-10 DIAGNOSIS — E663 Overweight: Secondary | ICD-10-CM | POA: Insufficient documentation

## 2019-05-10 DIAGNOSIS — Z9189 Other specified personal risk factors, not elsewhere classified: Secondary | ICD-10-CM | POA: Insufficient documentation

## 2019-05-10 DIAGNOSIS — Z789 Other specified health status: Secondary | ICD-10-CM | POA: Insufficient documentation

## 2019-05-10 MED ORDER — AMLODIPINE BESYLATE 5 MG PO TABS: 5.0000 mg | ORAL_TABLET | Freq: Every day | ORAL | 1 refills | Status: DC

## 2019-05-10 MED ORDER — LOSARTAN POTASSIUM 100 MG PO TABS: 100.0000 mg | ORAL_TABLET | Freq: Every day | ORAL | 1 refills | Status: DC

## 2019-05-10 MED ORDER — METFORMIN HCL 850 MG PO TABS: 850.0000 mg | ORAL_TABLET | Freq: Two times a day (BID) | ORAL | 3 refills | Status: DC

## 2019-05-11 ENCOUNTER — Other Ambulatory Visit: Payer: Self-pay

## 2019-05-11 NOTE — Patient Outreach (Signed)
[facility]) Care Management  05/11/2019  [name] 1951/01/11 [id number]   Medication Adherence call to Mrs. Kelli Mcintyre HIPPA Compliant Voice message left with a call back number. Kelli Mcintyre is showing past due on Atorvastatin 10 mg under [place].   [facility] Management Direct Dial [phone]  Fax [phone] [email or web address]

## 2019-05-25 ENCOUNTER — Other Ambulatory Visit: Payer: Self-pay

## 2019-05-25 NOTE — Patient Outreach (Signed)
[facility]) Care Management  05/25/2019  Kelli Mcintyre 21-Jan-1951 [id number]   Medication Adherence call to Kelli Mcintyre HIPPA Compliant Voice message left with a call back number. Kelli Mcintyre is showing past due under [place].   [facility] Management Direct Dial [phone]  Fax [phone] [email or web address]

## 2019-06-21 ENCOUNTER — Encounter: Payer: Medicare Other | Admitting: Family Medicine

## 2019-06-21 NOTE — Progress Notes (Signed)
ERROR

## 2019-06-26 ENCOUNTER — Other Ambulatory Visit: Payer: Self-pay | Admitting: Family Medicine

## 2019-06-26 DIAGNOSIS — R7303 Prediabetes: Secondary | ICD-10-CM

## 2019-06-26 DIAGNOSIS — I10 Essential (primary) hypertension: Secondary | ICD-10-CM

## 2019-06-26 DIAGNOSIS — E7800 Pure hypercholesterolemia, unspecified: Secondary | ICD-10-CM

## 2019-06-28 ENCOUNTER — Ambulatory Visit (INDEPENDENT_AMBULATORY_CARE_PROVIDER_SITE_OTHER): Payer: Medicare Other | Admitting: Family Medicine

## 2019-06-28 ENCOUNTER — Encounter: Payer: Self-pay | Admitting: Family Medicine

## 2019-06-28 ENCOUNTER — Other Ambulatory Visit: Payer: Self-pay

## 2019-06-28 VITALS — BP 130/90 | HR 83 | Temp 97.9°F | Ht 60.0 in | Wt 139.6 lb

## 2019-06-28 DIAGNOSIS — E559 Vitamin D deficiency, unspecified: Secondary | ICD-10-CM | POA: Diagnosis not present

## 2019-06-28 DIAGNOSIS — M5441 Lumbago with sciatica, right side: Secondary | ICD-10-CM | POA: Diagnosis not present

## 2019-06-28 DIAGNOSIS — G8929 Other chronic pain: Secondary | ICD-10-CM

## 2019-06-28 DIAGNOSIS — Z0000 Encounter for general adult medical examination without abnormal findings: Secondary | ICD-10-CM | POA: Diagnosis not present

## 2019-06-28 DIAGNOSIS — E7800 Pure hypercholesterolemia, unspecified: Secondary | ICD-10-CM | POA: Diagnosis not present

## 2019-06-28 DIAGNOSIS — T466X5A Adverse effect of antihyperlipidemic and antiarteriosclerotic drugs, initial encounter: Secondary | ICD-10-CM

## 2019-06-28 DIAGNOSIS — M8580 Other specified disorders of bone density and structure, unspecified site: Secondary | ICD-10-CM

## 2019-06-28 DIAGNOSIS — E8881 Metabolic syndrome: Secondary | ICD-10-CM

## 2019-06-28 DIAGNOSIS — I10 Essential (primary) hypertension: Secondary | ICD-10-CM | POA: Diagnosis not present

## 2019-06-28 DIAGNOSIS — M791 Myalgia, unspecified site: Secondary | ICD-10-CM

## 2019-06-28 DIAGNOSIS — E88819 Insulin resistance, unspecified: Secondary | ICD-10-CM

## 2019-06-28 LAB — CBC WITH DIFFERENTIAL/PLATELET
Basophils Absolute: 0.1 10*3/uL (ref 0.0–0.1)
Basophils Relative: 1.1 % (ref 0.0–3.0)
Eosinophils Absolute: 0.3 10*3/uL (ref 0.0–0.7)
Eosinophils Relative: 5.8 % — ABNORMAL HIGH (ref 0.0–5.0)
HCT: 37.9 % (ref 36.0–46.0)
Hemoglobin: 12.9 g/dL (ref 12.0–15.0)
Lymphocytes Relative: 32.1 % (ref 12.0–46.0)
Lymphs Abs: 1.9 10*3/uL (ref 0.7–4.0)
MCHC: 34.1 g/dL (ref 30.0–36.0)
MCV: 87.7 fl (ref 78.0–100.0)
Monocytes Absolute: 0.4 10*3/uL (ref 0.1–1.0)
Monocytes Relative: 7.5 % (ref 3.0–12.0)
Neutro Abs: 3.1 10*3/uL (ref 1.4–7.7)
Neutrophils Relative %: 53.5 % (ref 43.0–77.0)
Platelets: 246 10*3/uL (ref 150.0–400.0)
RBC: 4.32 Mil/uL (ref 3.87–5.11)
RDW: 13.6 % (ref 11.5–15.5)
WBC: 5.8 10*3/uL (ref 4.0–10.5)

## 2019-06-28 LAB — COMPREHENSIVE METABOLIC PANEL
ALT: 21 U/L (ref 0–35)
AST: 23 U/L (ref 0–37)
Albumin: 4.3 g/dL (ref 3.5–5.2)
Alkaline Phosphatase: 38 U/L — ABNORMAL LOW (ref 39–117)
BUN: 19 mg/dL (ref 6–23)
CO2: 26 mEq/L (ref 19–32)
Calcium: 9.6 mg/dL (ref 8.4–10.5)
Chloride: 105 mEq/L (ref 96–112)
Creatinine, Ser: 1.08 mg/dL (ref 0.40–1.20)
GFR: 50.26 mL/min — ABNORMAL LOW (ref >60.00)
Glucose, Bld: 95 mg/dL (ref 70–99)
Potassium: 4.2 mEq/L (ref 3.5–5.1)
Sodium: 139 mEq/L (ref 135–145)
Total Bilirubin: 0.6 mg/dL (ref 0.2–1.2)
Total Protein: 7 g/dL (ref 6.0–8.3)

## 2019-06-28 LAB — LIPID PANEL
Cholesterol: 207 mg/dL — ABNORMAL HIGH (ref 0–200)
HDL: 57.5 mg/dL (ref >39.00)
LDL Cholesterol: 130 mg/dL — ABNORMAL HIGH (ref 0–99)
NonHDL: 149.54
Total CHOL/HDL Ratio: 4
Triglycerides: 100 mg/dL (ref 0.0–149.0)
VLDL: 20 mg/dL (ref 0.0–40.0)

## 2019-06-28 LAB — HEMOGLOBIN A1C: Hgb A1c MFr Bld: 6.1 % (ref 4.6–6.5)

## 2019-06-28 LAB — VITAMIN D 25 HYDROXY (VIT D DEFICIENCY, FRACTURES): VITD: 60.14 ng/mL (ref 30.00–100.00)

## 2019-06-28 NOTE — Patient Instructions (Addendum)
Health Maintenance  Topic Date Due  . DEXA SCAN  03/13/2018  . INFLUENZA VACCINE  06/18/2019  . MAMMOGRAM  01/14/2021  . COLONOSCOPY  05/18/2027  . TETANUS/TDAP  12/27/2027  . PNA vac Low Risk Adult  Completed    High Cholesterol  High cholesterol is a condition in which the blood has high levels of a white, waxy, fat-like substance (cholesterol). The human body needs small amounts of cholesterol. The liver makes all the cholesterol that the body needs. Extra (excess) cholesterol comes from the food that we eat. Cholesterol is carried from the liver by the blood through the blood vessels. If you have high cholesterol, deposits (plaques) may build up on the walls of your blood vessels (arteries). Plaques make the arteries narrower and stiffer. Cholesterol plaques increase your risk for heart attack and stroke. Work with your health care provider to keep your cholesterol levels in a healthy range. What increases the risk? This condition is more likely to develop in people who:  Eat foods that are high in animal fat (saturated fat) or cholesterol.  Are overweight.  Are not getting enough exercise.  Have a family history of high cholesterol. What are the signs or symptoms? There are no symptoms of this condition. How is this diagnosed? This condition may be diagnosed from the results of a blood test.  If you are older than age 69, your health care provider may check your cholesterol every 4-6 years.  You may be checked more often if you already have high cholesterol or other risk factors for heart disease. The blood test for cholesterol measures:  "Bad" cholesterol (LDL cholesterol). This is the main type of cholesterol that causes heart disease. The desired level for LDL is less than 100.  "Good" cholesterol (HDL cholesterol). This type helps to protect against heart disease by cleaning the arteries and carrying the LDL away. The desired level for HDL is 60 or  higher.  Triglycerides. These are fats that the body can store or burn for energy. The desired number for triglycerides is lower than 150.  Total cholesterol. This is a measure of the total amount of cholesterol in your blood, including LDL cholesterol, HDL cholesterol, and triglycerides. A healthy number is less than 200. How is this treated? This condition is treated with diet changes, lifestyle changes, and medicines. Diet changes  This may include eating more whole grains, fruits, vegetables, nuts, and fish.  This may also include cutting back on red meat and foods that have a lot of added sugar. Lifestyle changes  Changes may include getting at least 40 minutes of aerobic exercise 3 times a week. Aerobic exercises include walking, biking, and swimming. Aerobic exercise along with a healthy diet can help you maintain a healthy weight.  Changes may also include quitting smoking. Medicines  Medicines are usually given if diet and lifestyle changes have failed to reduce your cholesterol to healthy levels.  Your health care provider may prescribe a statin medicine. Statin medicines have been shown to reduce cholesterol, which can reduce the risk of heart disease. Follow these instructions at home: Eating and drinking If told by your health care provider:  Eat chicken (without skin), fish, veal, shellfish, ground [place]key breast, and round or loin cuts of red meat.  Do not eat fried foods or fatty meats, such as hot dogs and salami.  Eat plenty of fruits, such as apples.  Eat plenty of vegetables, such as broccoli, potatoes, and carrots.  Eat beans, peas, and  lentils.  Eat grains such as barley, rice, couscous, and bulgur wheat.  Eat pasta without cream sauces.  Use skim or nonfat milk, and eat low-fat or nonfat yogurt and cheeses.  Do not eat or drink whole milk, cream, ice cream, egg yolks, or hard cheeses.  Do not eat stick margarine or tub margarines that contain trans  fats (also called partially hydrogenated oils).  Do not eat saturated tropical oils, such as coconut oil and palm oil.  Do not eat cakes, cookies, crackers, or other baked goods that contain trans fats.  General instructions  Exercise as directed by your health care provider. Increase your activity level with activities such as gardening, walking, and taking the stairs.  Take over-the-counter and prescription medicines only as told by your health care provider.  Do not use any products that contain nicotine or tobacco, such as cigarettes and e-cigarettes. If you need help quitting, ask your health care provider.  Keep all follow-up visits as told by your health care provider. This is important. Contact a health care provider if:  You are struggling to maintain a healthy diet or weight.  You need help to start on an exercise program.  You need help to stop smoking. Get help right away if:  You have chest pain.  You have trouble breathing. This information is not intended to replace advice given to you by your health care provider. Make sure you discuss any questions you have with your health care provider. Document Released: 11/03/2005 Document Revised: 11/06/2017 Document Reviewed: 05/03/2016 Elsevier Patient Education  2020 [employer].

## 2019-06-28 NOTE — Progress Notes (Signed)
Subjective:   The patient is here for annual Medicare wellness examination and management of other chronic and acute problems.  . The risk factors are reflected in the social history. . The roster of all physicians providing medical care to patient - is listed in the Snapshot section of the chart. . Activities of daily living:  The patient is 100% independent in all ADLs: dressing, toileting, feeding as well as independent mobility. . Home safety  The patient has smoke detectors in the home. They wear seatbelts.  There are no firearms at home. There is no violence in the home.  . There is no risks for hepatitis, STDs, or HIV. There is no history of blood transfusion. They have no travel history to infectious disease endemic areas of the world. . The patient has seen a dentist in the last six months. They have seen their eye doctor in the last year. They admit to slight hearing difficulty with regard to whispered voices and some television programs.  They have deferred audiologic testing in the last year.  They do not  have excessive sun exposure. Discussed the need for sun protection: hats, long sleeves and use of sunscreen if there is significant sun exposure.  . The importance of a healthy diet is discussed. They do have a healthy diet. . The benefits of regular aerobic exercise were discussed.  . There are no signs or vegative symptoms of depression, including irritability, change in appetite, anhedonia, sadness/tearfullness. . Cognitive assessment completed and without concerns. . The following portions of the patient's history were reviewed and updated as appropriate: allergies, current medications, past family history, past medical history, past surgical history, past social history  and problem list.  Patient Care Team: Briscoe Deutscher, DO as PCP - General (Family Medicine)  Past medical, surgical, social and family history reviewed and updated:  Patient Active Problem List    Diagnosis Date Noted  . Myalgia due to statin 06/28/2019  . Cardiovascular event risk 11.3% 05/10/2019  . Overweight (BMI 25.0-29.9) 05/10/2019  . Statin intolerance 05/10/2019  . Hyperlipidemia, statin myalgia 12/07/2018  . Insulin resistance 03/23/2018  . Osteopenia 03/23/2018  . Pure hypercholesterolemia 03/23/2018  . Essential hypertension 06/15/2017  . Chronic bilateral low back pain with right-sided sciatica, MRI with DDD, facet arthropathy, advanced spinal stenosis L4-L5 06/15/2017  . SI (sacroiliac) pain 06/15/2017   Past Surgical History:  Procedure Laterality Date  . CATARACT EXTRACTION N/A   . TUBAL LIGATION     Social History   Tobacco Use  . Smoking status: Never Smoker  . Smokeless tobacco: Never Used  Substance Use Topics  . Alcohol use: No   Family History  Problem Relation Age of Onset  . Diabetes Mother   . Hypertension Mother     Current medication list and allergy/intolerance information reviewed and updated:   .  amLODipine (NORVASC) 5 MG tablet, Take 1 tablet (5 mg total) by mouth daily., Disp: 90 tablet, Rfl: 1 .  Ascorbic Acid (VITAMIN C) 1000 MG tablet, Take 1,000 mg by mouth daily., Disp: , Rfl:  .  losartan (COZAAR) 100 MG tablet, Take 1 tablet (100 mg total) by mouth daily., Disp: 90 tablet, Rfl: 1 .  metFORMIN (GLUCOPHAGE) 850 MG tablet, Take 1 tablet (850 mg total) by mouth 2 (two) times daily with a meal., Disp: 60 tablet, Rfl: 3 .  Multiple Vitamin (MULTIVITAMIN) tablet, Take 1 tablet by mouth daily., Disp: , Rfl:   Allergies  Allergen Reactions  . Lipitor [  Atorvastatin Calcium] Other (See Comments)    Muscle pain    Review of Systems  Constitutional: Negative for chills, fever, malaise/fatigue and weight loss.  Respiratory: Negative for cough, shortness of breath and wheezing.   Cardiovascular: Negative for chest pain, palpitations and leg swelling.  Gastrointestinal: Negative for abdominal pain, constipation, diarrhea, nausea and  vomiting.  Genitourinary: Negative for dysuria and urgency.  Musculoskeletal: Negative for joint pain and myalgias.  Skin: Negative for rash.  Neurological: Negative for dizziness and headaches.  Psychiatric/Behavioral: Negative for depression, substance abuse and suicidal ideas. The patient is not nervous/anxious.    Health Risk Assessment:   CLINICAL INTAKE, INCLUDING ADLS, SENSORY DIFFICULTY Pre-visit preparation completed: Yes  Nutritional Status: BMI 25 -29 Overweight Nutritional Risks: None Diabetes: No  Activities of Daily Living: Independent Ambulation: Independent Medication Administration: Independent Home Management: Independent  Barriers to Care Management & Learning: Language  Interpreter Needed?: Yes  GOALS Goals    . Weight (lb) < 145 lb (65.8 kg)     Will eat healthy carbs and exercise!      FUNCTIONAL STATUS SURVEY, EXERCISE, CARDIAC RISK FACTORS Is the patient deaf or have difficulty hearing?: No Does the patient have difficulty seeing, even when wearing glasses/contacts?: No Does the patient have difficulty concentrating, remembering, or making decisions?: No Does the patient have difficulty walking or climbing stairs?: No Does the patient have difficulty dressing or bathing?: No Does the patient have difficulty doing errands alone such as visiting a doctor's office or shopping?: No  DEPRESSION QUESTIONNAIRE [facility] screen [facility] 06/28/2019 11/26/2018 12/24/2017  Decreased Interest 0 1 0  Down, Depressed, Hopeless 0 1 0  PHQ - 2 Score 0 2 0  Altered sleeping - 2 -  Tired, decreased energy - 2 -  Change in appetite - 1 -  Feeling bad or failure about yourself  - 1 -  Trouble concentrating - 1 -  Moving slowly or fidgety/restless - 0 -  Suicidal thoughts - 0 -  PHQ-9 Score - 9 -  Difficult doing work/chores - Somewhat difficult -   FALL RISK Fall Risk  06/28/2019 11/26/2018 12/24/2017 06/15/2017  Falls in the past year? 0 0 No No  Number falls in  past yr: - 0 - -  Injury with Fall? - 0 - -   COGNITIVE FUNCTION Unable to complete, language barrier.  Objective:   Vitals:   06/28/19 0822  BP: 130/90  Pulse: 83  Temp: 97.9 F (36.6 C)  SpO2: 97%   Body mass index is 27.26 kg/m.  Physical Exam  Constitutional: She is oriented to person, place, and time. She appears well-developed and well-nourished. No distress.  HENT:  Head: Normocephalic and atraumatic.  Right Ear: External ear normal.  Left Ear: External ear normal.  Mouth/Throat: No oropharyngeal exudate.  Eyes: Pupils are equal, round, and reactive to light. Conjunctivae are normal.  Neck: Normal range of motion. Neck supple. No JVD present. No thyromegaly present.  Cardiovascular: Normal rate, regular rhythm and normal heart sounds. Exam reveals no gallop and no friction rub.  No murmur heard. Pulmonary/Chest: Effort normal and breath sounds normal. She has no wheezes. She exhibits no tenderness.  Abdominal: Soft. Bowel sounds are normal. She exhibits no mass. There is no abdominal tenderness. There is no rebound and no guarding.  Musculoskeletal: Normal range of motion.  Lymphadenopathy:    She has no cervical adenopathy.  Neurological: She is alert and oriented to person, place, and time. She has normal reflexes.  Skin: No rash noted.  Psychiatric: She has a normal mood and affect. Her behavior is normal. Judgment and thought content normal.   Timed Get Up and Go performed: yes, taking < 12 seconds  Assessment and Plan:   Kelli Mcintyre was seen today for follow-up.  Diagnoses and all orders for this visit:  Medicare annual wellness visit, subsequent  Chronic bilateral low back pain with right-sided sciatica, MRI with DDD, facet arthropathy, advanced spinal stenosis L4-L5  Pure hypercholesterolemia -     Comprehensive metabolic panel -     Lipid panel  Insulin resistance -     Hemoglobin A1c  Osteopenia, unspecified location -     DG Bone Density -      CBC with Differential/Platelet  Essential hypertension  Myalgia due to statin  Vitamin D deficiency -     VITAMIN D 25 Hydroxy (Vit-D Deficiency, Fractures)   Immunization History  Administered Date(s) Administered  . Influenza Split 08/25/2015, 08/30/2016  . Influenza, High Dose Seasonal PF 08/20/2018  . Influenza,inj,Quad PF,6+ Mos 07/29/2017  . Pneumococcal Conjugate-13 12/26/2017  . Pneumococcal Polysaccharide-23 12/26/2018  . Tdap 12/26/2017  . Zoster Recombinat (Shingrix) 02/10/2018, 06/08/2018   Screening Tests Health Maintenance  Topic Date Due  . DEXA SCAN  03/13/2018  . INFLUENZA VACCINE  06/18/2019  . MAMMOGRAM  01/14/2021  . COLONOSCOPY  05/18/2027  . TETANUS/TDAP  12/27/2027  . PNA vac Low Risk Adult  Completed  . Hepatitis C Screening  Discontinued   Cancer Screenings: Lung: Low Dose CT Chest recommended if Age 89-80 years, 30 pack-year currently smoking OR have quit w/in 15 years. Patient does not qualify. Breast:  Up to date on Mammogram? Yes   Up to date of Bone Density/Dexa? No  Advanced Directives has an advanced directive - a copy HAS NOT been provided.  I have personally reviewed and noted the following in the patient's chart:   . Medical and social history . Use of alcohol, tobacco or illicit drugs  . Current medications and supplements . Functional ability and status . Nutritional status . Physical activity . Advanced directives . List of other physicians . Hospitalizations, surgeries, and ER visits in previous 12 months . Vitals . Screenings to include cognitive, depression, and falls . Referrals and appointments  In addition, I have reviewed and discussed with patient certain preventive protocols, quality metrics, and best practice recommendations. A personalized care plan for preventive services as well as general preventive health recommendations were provided to patient.   Helane RimaErica Sky Borboa, DO

## 2019-08-12 ENCOUNTER — Encounter: Payer: Self-pay | Admitting: Family Medicine

## 2019-09-22 ENCOUNTER — Ambulatory Visit: Payer: Medicare Other | Admitting: Obstetrics and Gynecology

## 2019-09-30 ENCOUNTER — Ambulatory Visit: Payer: Medicare Other | Admitting: Family Medicine

## 2019-11-01 ENCOUNTER — Other Ambulatory Visit: Payer: Self-pay | Admitting: Family Medicine

## 2019-11-01 DIAGNOSIS — I10 Essential (primary) hypertension: Secondary | ICD-10-CM

## 2019-11-04 ENCOUNTER — Other Ambulatory Visit: Payer: Self-pay | Admitting: Family Medicine

## 2019-11-04 DIAGNOSIS — E8881 Metabolic syndrome: Secondary | ICD-10-CM

## 2019-11-09 ENCOUNTER — Other Ambulatory Visit: Payer: Self-pay

## 2019-11-09 ENCOUNTER — Encounter: Payer: Self-pay | Admitting: Family Medicine

## 2019-11-09 DIAGNOSIS — I10 Essential (primary) hypertension: Secondary | ICD-10-CM

## 2019-11-09 MED ORDER — AMLODIPINE BESYLATE 5 MG PO TABS: 5.0000 mg | ORAL_TABLET | Freq: Every day | ORAL | 0 refills | Status: DC

## 2019-11-09 MED ORDER — LOSARTAN POTASSIUM 100 MG PO TABS: 100.0000 mg | ORAL_TABLET | Freq: Every day | ORAL | 0 refills | Status: DC

## 2019-12-29 ENCOUNTER — Ambulatory Visit: Payer: Medicare Other | Attending: Family

## 2019-12-29 DIAGNOSIS — Z23 Encounter for immunization: Secondary | ICD-10-CM

## 2019-12-29 NOTE — Progress Notes (Signed)
   Covid-19 Vaccination Clinic  Name:  TRENISHA LAFAVOR    MRN: [id number] DOB: 07-17-1951  12/29/2019  Ms. Beadles was observed post Covid-19 immunization for 15 minutes without incidence. She was provided with Vaccine Information Sheet and instruction to access the V-Safe system.   Ms. Harshbarger was instructed to call 911 with any severe reactions post vaccine: Marland Kitchen Difficulty breathing  . Swelling of your face and throat  . A fast heartbeat  . A bad rash all over your body  . Dizziness and weakness    Immunizations Administered    Name Date Dose VIS Date Route   Moderna COVID-19 Vaccine 12/29/2019 12:01 PM 0.5 mL 10/18/2019 Intramuscular   Manufacturer: Moderna   Lot: [id number]   NDC: 03524-818-59

## 2020-01-27 ENCOUNTER — Encounter: Payer: Self-pay | Admitting: Family Medicine

## 2020-01-27 ENCOUNTER — Ambulatory Visit (INDEPENDENT_AMBULATORY_CARE_PROVIDER_SITE_OTHER): Payer: Medicare Other | Admitting: Family Medicine

## 2020-01-27 VITALS — BP 119/75 | HR 81 | Ht 60.0 in | Wt 140.0 lb

## 2020-01-27 DIAGNOSIS — R7303 Prediabetes: Secondary | ICD-10-CM | POA: Diagnosis not present

## 2020-01-27 DIAGNOSIS — M47816 Spondylosis without myelopathy or radiculopathy, lumbar region: Secondary | ICD-10-CM | POA: Diagnosis not present

## 2020-01-27 DIAGNOSIS — M791 Myalgia, unspecified site: Secondary | ICD-10-CM

## 2020-01-27 DIAGNOSIS — T466X5A Adverse effect of antihyperlipidemic and antiarteriosclerotic drugs, initial encounter: Secondary | ICD-10-CM

## 2020-01-27 DIAGNOSIS — I10 Essential (primary) hypertension: Secondary | ICD-10-CM

## 2020-01-27 DIAGNOSIS — E782 Mixed hyperlipidemia: Secondary | ICD-10-CM | POA: Diagnosis not present

## 2020-01-27 DIAGNOSIS — M48061 Spinal stenosis, lumbar region without neurogenic claudication: Secondary | ICD-10-CM | POA: Diagnosis not present

## 2020-01-27 HISTORY — DX: Spinal stenosis, lumbar region without neurogenic claudication: M48.061

## 2020-01-27 MED ORDER — METFORMIN HCL 500 MG PO TABS: 500.0000 mg | ORAL_TABLET | Freq: Two times a day (BID) | ORAL | 3 refills | Status: DC

## 2020-01-27 NOTE — Progress Notes (Signed)
Virtual Visit via Video Note  Subjective  CC:  Chief Complaint  Patient presents with  . Transitions Of Care    TOC from Dr. Earlene Plater  . Hypertension    checks readings at home. <140/80. No HA, dizziness or visaual changes  . Hyperlipidemia    does not takes any medication for cholesterol     I connected with Gwenyth Bender on 01/27/20 at  1:30 PM EST by a video enabled telemedicine application and verified that I am speaking with the correct person using two identifiers. Location patient: Home Location provider: [facility] [address] Office Persons participating in the virtual visit: Shaunette J Carlisle Beers, MD Gracy Racer, CMA  I discussed the limitations of evaluation and management by telemedicine and the availability of in person appointments. The patient expressed understanding and agreed to proceed. HPI: Kelli Mcintyre is a 69 y.o. female who presents to the office today to address the problems listed above in the chief complaint.  Hypertension f/u: Control is fair . Pt reports she is doing well. taking medications as instructed, no medication side effects noted, no TIAs, no chest pain on exertion, no dyspnea on exertion, no swelling of ankles. She denies adverse effects from his BP medications. Compliance with medication is good.   HLD on red yeast rice and fish oil. Intolerant to lipitor due to myalgias and does NOT want to try another one at this time. Hasn't been on zetia either.   Prediabetes: on metformin 850 bid with frequent loose stools. Requests decreasing dose. No sxs of hyperglycemia  Lumbar djd and spinal stenosis is stable.   HM: overdue for mammogram and dexa but pt defers until feels that she is safe from covid. Reviewed dexa from 2018: all T scores > -1.0, normal. rec repeat in 3-5 years. imms up to date. To get second covid vaccine soon.  Lab Results  Component Value Date   CHOL 207 (H) 06/28/2019   CHOL 212 (H) 09/29/2018   CHOL  227 (H) 06/23/2018   Lab Results  Component Value Date   HDL 57.50 06/28/2019   HDL 54.10 09/29/2018   HDL 49.70 06/23/2018   Lab Results  Component Value Date   LDLCALC 130 (H) 06/28/2019   LDLCALC 125 (H) 09/29/2018   LDLCALC 139 (H) 06/23/2018   Lab Results  Component Value Date   TRIG 100.0 06/28/2019   TRIG 165.0 (H) 09/29/2018   TRIG 190.0 (H) 06/23/2018   Lab Results  Component Value Date   CHOLHDL 4 06/28/2019   CHOLHDL 4 09/29/2018   CHOLHDL 5 06/23/2018   No results found for: LDLDIRECT BP Readings from Last 3 Encounters:  01/27/20 119/75  06/28/19 130/90  05/09/19 126/80   Lab Results  Component Value Date   CREATININE 1.08 06/28/2019   BUN 19 06/28/2019   NA 139 06/28/2019   K 4.2 06/28/2019   CL 105 06/28/2019   CO2 26 06/28/2019   Lab Results  Component Value Date   HGBA1C 6.1 06/28/2019   HGBA1C 6.2 09/29/2018   HGBA1C 6.4 06/23/2018    The 10-year ASCVD risk score Denman George DC Jr., et al., 2013) is: 18.3%   Values used to calculate the score:     Age: 62 years     Sex: Female     Is Non-Hispanic African American: No     Diabetic: Yes     Tobacco smoker: No     Systolic Blood  Pressure: 119 mmHg     Is BP treated: Yes     HDL Cholesterol: 57.5 mg/dL     Total Cholesterol: 207 mg/dL  Assessment  1. Prediabetes   2. Mixed hyperlipidemia   3. Essential hypertension   4. Spinal stenosis of lumbar region without neurogenic claudication   5. Spondylosis of lumbar region without myelopathy or radiculopathy   6. Myalgia due to statin      Plan    Hypertension f/u: BP control is well controlled. Continue current medications  Hyperlipidemia f/u: will discuss with pt retrial of statin or zetia in the future.   Prediabetes: agree with decreasing metformin dose to improve sxs.discussed diet  Low back pain is stable.   HM: deferring mammo and dexa until later this summer or fall. Pt to call for appt Education regarding management of these  chronic disease states was given. Management strategies discussed on successive visits include dietary and exercise recommendations, goals of achieving and maintaining IBW, and lifestyle modifications aiming for adequate sleep and minimizing stressors.   Follow up: aug for cpe. Needs interpreter.   Meds ordered this encounter  Medications  . metFORMIN (GLUCOPHAGE) 500 MG tablet    Sig: Take 1 tablet (500 mg total) by mouth 2 (two) times daily with a meal.    Dispense:  180 tablet    Refill:  3      I reviewed the patients updated PMH, FH, and SocHx.    Patient Active Problem List   Diagnosis Date Noted  . Mixed hyperlipidemia 12/07/2018    Priority: High  . Prediabetes 03/23/2018    Priority: High  . Essential hypertension 06/15/2017    Priority: High  . Lumbar spinal stenosis 01/27/2020    Priority: Medium  . DJD (degenerative joint disease), lumbar 06/15/2017    Priority: Medium  . Myalgia due to statin 06/28/2019    Priority: Low   Current Meds  Medication Sig  . amLODipine (NORVASC) 5 MG tablet Take 1 tablet (5 mg total) by mouth daily.  . Ascorbic Acid (VITAMIN C) 1000 MG tablet Take 1,000 mg by mouth daily.  . Cholecalciferol (VITAMIN D3) 50 MCG (2000 UT) TABS Take 50 mcg by mouth daily.  . Cyanocobalamin (VITAMIN B12) 3000 MCG SUBL Take 3,000 mcg by mouth daily.  Marland Kitchen losartan (COZAAR) 100 MG tablet Take 1 tablet (100 mg total) by mouth daily.  . Magnesium 250 MG TABS Take 250 mg by mouth daily.  Marnee [place] Omega-3 Krill Oil 500 MG CAPS Take 500 mg by mouth daily.  . Multiple Vitamin (MULTIVITAMIN) tablet Take 1 tablet by mouth daily.  . Omega-3 Fatty Acids (FISH OIL) 1200 MG CAPS Take 1,200 mg by mouth daily.  . Zinc 30 MG TABS Take 30 mg by mouth daily.    Allergies: Patient is allergic to lipitor [atorvastatin calcium]. Family History: Patient family history includes Diabetes in her mother; Hypertension in her mother. Social History:  Patient  reports that she  has never smoked. She has never used smokeless tobacco. She reports that she does not drink alcohol or use drugs.  Review of Systems: Constitutional: Negative for fever malaise or anorexia Cardiovascular: negative for chest pain Respiratory: negative for SOB or persistent cough Gastrointestinal: negative for abdominal pain  OBJECTIVE Vitals: BP 119/75   Pulse 81   Ht 5' (1.524 m)   Wt 140 lb (63.5 kg)   BMI 27.34 kg/m  General: no acute distress , A&Ox3  Leamon Arnt, MD

## 2020-01-31 ENCOUNTER — Ambulatory Visit: Payer: Medicare Other | Attending: Family

## 2020-01-31 DIAGNOSIS — Z23 Encounter for immunization: Secondary | ICD-10-CM

## 2020-01-31 NOTE — Progress Notes (Signed)
   Covid-19 Vaccination Clinic  Name:  BELINDA SCHLICHTING    MRN: [id number] DOB: [birth year]  01/31/2020  Ms. Makris was observed post Covid-19 immunization for 15 minutes without incident. She was provided with Vaccine Information Sheet and instruction to access the V-Safe system.   Ms. Brau was instructed to call 911 with any severe reactions post vaccine: Marland Kitchen Difficulty breathing  . Swelling of face and throat  . A fast heartbeat  . A bad rash all over body  . Dizziness and weakness   Immunizations Administered    Name Date Dose VIS Date Route   Moderna COVID-19 Vaccine 01/31/2020 11:36 AM 0.5 mL 10/18/2019 Intramuscular   Manufacturer: Moderna   Lot: [id number]   NDC: 94712-527-12

## 2020-02-01 ENCOUNTER — Other Ambulatory Visit: Payer: Self-pay | Admitting: Family Medicine

## 2020-02-01 DIAGNOSIS — I10 Essential (primary) hypertension: Secondary | ICD-10-CM

## 2020-04-25 ENCOUNTER — Ambulatory Visit (INDEPENDENT_AMBULATORY_CARE_PROVIDER_SITE_OTHER): Payer: Medicare Other | Admitting: Family Medicine

## 2020-04-25 ENCOUNTER — Other Ambulatory Visit: Payer: Self-pay

## 2020-04-25 ENCOUNTER — Encounter: Payer: Self-pay | Admitting: Family Medicine

## 2020-04-25 VITALS — BP 120/80 | HR 82 | Temp 96.8°F | Resp 15 | Ht 60.0 in | Wt 142.0 lb

## 2020-04-25 DIAGNOSIS — E2839 Other primary ovarian failure: Secondary | ICD-10-CM

## 2020-04-25 DIAGNOSIS — Z1231 Encounter for screening mammogram for malignant neoplasm of breast: Secondary | ICD-10-CM

## 2020-04-25 DIAGNOSIS — R7303 Prediabetes: Secondary | ICD-10-CM | POA: Diagnosis not present

## 2020-04-25 DIAGNOSIS — E782 Mixed hyperlipidemia: Secondary | ICD-10-CM | POA: Diagnosis not present

## 2020-04-25 DIAGNOSIS — I10 Essential (primary) hypertension: Secondary | ICD-10-CM

## 2020-04-25 LAB — CBC WITH DIFFERENTIAL/PLATELET
Basophils Absolute: 0.1 10*3/uL (ref 0.0–0.1)
Basophils Relative: 0.9 % (ref 0.0–3.0)
Eosinophils Absolute: 0.2 10*3/uL (ref 0.0–0.7)
Eosinophils Relative: 3.3 % (ref 0.0–5.0)
HCT: 37.7 % (ref 36.0–46.0)
Hemoglobin: 12.6 g/dL (ref 12.0–15.0)
Lymphocytes Relative: 33.2 % (ref 12.0–46.0)
Lymphs Abs: 2.4 10*3/uL (ref 0.7–4.0)
MCHC: 33.5 g/dL (ref 30.0–36.0)
MCV: 87 fl (ref 78.0–100.0)
Monocytes Absolute: 0.7 10*3/uL (ref 0.1–1.0)
Monocytes Relative: 9.1 % (ref 3.0–12.0)
Neutro Abs: 3.9 10*3/uL (ref 1.4–7.7)
Neutrophils Relative %: 53.5 % (ref 43.0–77.0)
Platelets: 237 10*3/uL (ref 150.0–400.0)
RBC: 4.33 Mil/uL (ref 3.87–5.11)
RDW: 14 % (ref 11.5–15.5)
WBC: 7.2 10*3/uL (ref 4.0–10.5)

## 2020-04-25 LAB — COMPREHENSIVE METABOLIC PANEL
ALT: 17 U/L (ref 0–35)
AST: 21 U/L (ref 0–37)
Albumin: 4.3 g/dL (ref 3.5–5.2)
Alkaline Phosphatase: 42 U/L (ref 39–117)
BUN: 20 mg/dL (ref 6–23)
CO2: 29 mEq/L (ref 19–32)
Calcium: 9.6 mg/dL (ref 8.4–10.5)
Chloride: 101 mEq/L (ref 96–112)
Creatinine, Ser: 1 mg/dL (ref 0.40–1.20)
GFR: 54.8 mL/min — ABNORMAL LOW (ref >60.00)
Glucose, Bld: 94 mg/dL (ref 70–99)
Potassium: 4.2 mEq/L (ref 3.5–5.1)
Sodium: 138 mEq/L (ref 135–145)
Total Bilirubin: 0.6 mg/dL (ref 0.2–1.2)
Total Protein: 7.2 g/dL (ref 6.0–8.3)

## 2020-04-25 LAB — LIPID PANEL
Cholesterol: 203 mg/dL — ABNORMAL HIGH (ref 0–200)
HDL: 55.2 mg/dL (ref >39.00)
LDL Cholesterol: 114 mg/dL — ABNORMAL HIGH (ref 0–99)
NonHDL: 148.17
Total CHOL/HDL Ratio: 4
Triglycerides: 172 mg/dL — ABNORMAL HIGH (ref 0.0–149.0)
VLDL: 34.4 mg/dL (ref 0.0–40.0)

## 2020-04-25 LAB — TSH: TSH: 2.18 u[IU]/mL (ref 0.35–4.50)

## 2020-04-25 LAB — POCT GLYCOSYLATED HEMOGLOBIN (HGB A1C): Hemoglobin A1C: 5.7 % — AB (ref 4.0–5.6)

## 2020-04-25 NOTE — Progress Notes (Signed)
Subjective  CC:  Chief Complaint  Patient presents with   Prediabetes   Hypertension    averaging around 120/80's at home    HPI: Kelli Mcintyre is a 69 y.o. female who presents to the office today for follow up of diabetes and problems listed above in the chief complaint.   PreDiabetes follow up: Her diabetic control is reported as Unchanged. We decreased metformin dose due to gi upset/diarrhea and doing better although still has some sxs. No sxs of hyperglycemia. She denies exertional CP or SOB or symptomatic hypoglycemia. She denies foot sores or paresthesias.   HTN: home readings are normal. Feeling well. Taking medications w/o adverse effects. No symptoms of CHF, angina; no palpitations, sob, cp or lower extremity edema. Compliant with meds.   HLD: nonfasting today and intolerant to statins. On red yeast rice. Hesitant to try zetia. Strong FH of CAD.   Overdue for mammo and dexa.   Wt Readings from Last 3 Encounters:  04/25/20 142 lb (64.4 kg)  01/27/20 140 lb (63.5 kg)  06/28/19 139 lb 9.6 oz (63.3 kg)    BP Readings from Last 3 Encounters:  04/25/20 120/80  01/27/20 119/75  06/28/19 130/90    Assessment  1. Prediabetes   2. Mixed hyperlipidemia   3. Essential hypertension   4. Encounter for screening mammogram for breast cancer   5. Hypoestrogenism      Plan   PreDiabetes is currently very well controlled. Improving even with decrease in meds. Stop met and recheck in 3 months.   HLD: recheck. Consider zetia if she will take it  HTN is controlled on amlodipine and arb. Check renal function and electrolytes.  Ordered screening mammo and dexa;pt is vaccinated. Hopefully she now feels comfortable going.   Follow up: Return in about 3 months (around 07/26/2020) for complete physical, follow up Diabetes.. Orders Placed This Encounter  Procedures   MM DIGITAL SCREENING BILATERAL   DG Bone Density   CBC with Differential/Platelet   Comprehensive metabolic  panel   Lipid panel   TSH   POCT HgB A1C   No orders of the defined types were placed in this encounter.     Immunization History  Administered Date(s) Administered   Influenza Split 08/25/2015, 08/30/2016   Influenza, High Dose Seasonal PF 08/20/2018, 08/16/2019   Influenza,inj,Quad PF,6+ Mos 07/29/2017   Moderna SARS-COVID-2 Vaccination 12/29/2019, 01/31/2020   Pneumococcal Conjugate-13 12/26/2017   Pneumococcal Polysaccharide-23 12/26/2018   Tdap 12/26/2017   Zoster Recombinat (Shingrix) 02/10/2018, 06/08/2018    Diabetes Related Lab Review: Lab Results  Component Value Date   HGBA1C 5.7 (A) 04/25/2020   HGBA1C 6.1 06/28/2019   HGBA1C 6.2 09/29/2018    No results found for: Derl Barrow Lab Results  Component Value Date   CREATININE 1.08 06/28/2019   BUN 19 06/28/2019   NA 139 06/28/2019   K 4.2 06/28/2019   CL 105 06/28/2019   CO2 26 06/28/2019   Lab Results  Component Value Date   CHOL 207 (H) 06/28/2019   CHOL 212 (H) 09/29/2018   CHOL 227 (H) 06/23/2018   Lab Results  Component Value Date   HDL 57.50 06/28/2019   HDL 54.10 09/29/2018   HDL 49.70 06/23/2018   Lab Results  Component Value Date   LDLCALC 130 (H) 06/28/2019   LDLCALC 125 (H) 09/29/2018   LDLCALC 139 (H) 06/23/2018   Lab Results  Component Value Date   TRIG 100.0 06/28/2019   TRIG 165.0 (H) 09/29/2018  TRIG 190.0 (H) 06/23/2018   Lab Results  Component Value Date   CHOLHDL 4 06/28/2019   CHOLHDL 4 09/29/2018   CHOLHDL 5 06/23/2018   No results found for: LDLDIRECT The 10-year ASCVD risk score Mikey Bussing DC Jr., et al., 2013) is: 20.5%   Values used to calculate the score:     Age: 89 years     Sex: Female     Is Non-Hispanic African American: No     Diabetic: Yes     Tobacco smoker: No     Systolic Blood Pressure: 341 mmHg     Is BP treated: Yes     HDL Cholesterol: 57.5 mg/dL     Total Cholesterol: 207 mg/dL I have reviewed the PMH, Fam and Soc  history. Patient Active Problem List   Diagnosis Date Noted   Mixed hyperlipidemia 12/07/2018    Priority: High   Prediabetes 03/23/2018    Priority: High   Essential hypertension 06/15/2017    Priority: High   Lumbar spinal stenosis 01/27/2020    Priority: Medium   DJD (degenerative joint disease), lumbar 06/15/2017    Priority: Medium    MRI LUMBAR 06/29/17: 1. Lumbar facet arthropathy most advanced at L4-5 where there is anterolisthesis. Milder disc degeneration with generalized disc bulging. 2. L4-5 advanced spinal stenosis. Moderate right foraminal impingement. 3. Noncompressive degenerative changes described above.     Myalgia due to statin 06/28/2019    Priority: Low    Social History: Patient  reports that she has never smoked. She has never used smokeless tobacco. She reports that she does not drink alcohol or use drugs.  Review of Systems: Ophthalmic: negative for eye pain, loss of vision or double vision Cardiovascular: negative for chest pain Respiratory: negative for SOB or persistent cough Gastrointestinal: negative for abdominal pain Genitourinary: negative for dysuria or gross hematuria MSK: negative for foot lesions Neurologic: negative for weakness or gait disturbance  Objective  Vitals: BP 120/80 Comment: rechecked in office by me/cla   Pulse 82    Temp (!) 96.8 F (36 C) (Temporal)    Resp 15    Ht 5' (1.524 m)    Wt 142 lb (64.4 kg)    SpO2 96%    BMI 27.73 kg/m  General: well appearing, no acute distress  Psych:  Alert and oriented, normal mood and affect HEENT:  Normocephalic, atraumatic, moist mucous membranes, supple neck  Cardiovascular:  Nl S1 and S2, RRR without murmur, gallop or rub. no edema Respiratory:  Good breath sounds bilaterally, CTAB with normal effort, no rales Foot exam: no erythema, pallor, or cyanosis visible nl proprioception and sensation to monofilament testing bilaterally, +2 distal pulses bilaterally, + white  discolration of right great toenail with ridge.   Diabetic education: ongoing education regarding chronic disease management for diabetes was given today. We continue to reinforce the ABC's of diabetic management: A1c (<7 or 8 dependent upon patient), tight blood pressure control, and cholesterol management with goal LDL < 100 minimally. We discuss diet strategies, exercise recommendations, medication options and possible side effects. At each visit, we review recommended immunizations and preventive care recommendations for diabetics and stress that good diabetic control can prevent other problems. See below for this patient's data.    Commons side effects, risks, benefits, and alternatives for medications and treatment plan prescribed today were discussed, and the patient expressed understanding of the given instructions. Patient is instructed to call or message via MyChart if he/she has any questions or concerns regarding  our treatment plan. No barriers to understanding were identified. We discussed Red Flag symptoms and signs in detail. Patient expressed understanding regarding what to do in case of urgent or emergency type symptoms.   Medication list was reconciled, printed and provided to the patient in AVS. Patient instructions and summary information was reviewed with the patient as documented in the AVS. This note was prepared with assistance of Dragon voice recognition software. Occasional wrong-word or sound-a-like substitutions may have occurred due to the inherent limitations of voice recognition software  This visit occurred during the SARS-CoV-2 public health emergency.  Safety protocols were in place, including screening questions prior to the visit, additional usage of staff PPE, and extensive cleaning of exam room while observing appropriate contact time as indicated for disinfecting solutions.

## 2020-04-25 NOTE — Patient Instructions (Signed)
Please return in 3 months for your annual complete physical; please come fasting.  I will release your lab results to you on your MyChart account with further instructions. Please reply with any questions.   You may stop the metformin. Keep eating a healthy diet and avoid sweets/desserts and candies as much as possible.   If you have any questions or concerns, please don't hesitate to send me a message via MyChart or call the office at [phone]. Thank you for visiting with [place] today! It's our pleasure caring for you.  I have ordered a mammogram and/or bone density for you as we discussed today: [x]   Mammogram  [x]   Bone Density  Please call the office checked below to schedule your appointment: Your appointment will at the following location  [x]   [facility]      [address] [place],        [address]         []   [facility]  [address] [place],  [facility]  Make sure to wear two peace clothing  No lotions powders or deodorants the day of the appointment Make sure to bring picture ID and insurance card.  Bring list of medications you are currently taking including any supplements.

## 2020-05-23 DIAGNOSIS — L603 Nail dystrophy: Secondary | ICD-10-CM | POA: Diagnosis not present

## 2020-06-25 ENCOUNTER — Telehealth: Payer: Self-pay | Admitting: Family Medicine

## 2020-06-25 NOTE — Telephone Encounter (Signed)
Left message for patient to call back and schedule Medicare Annual Wellness Visit (AWV) either virtually/audio only OR in office. Whatever the patients preference is.  Last AWV 06/28/19; please schedule 06/28/20 OR AFTER with [facility] at [place] [facility].  This should be a 45 minute visit.   

## 2020-07-22 ENCOUNTER — Encounter: Payer: Self-pay | Admitting: *Deleted

## 2020-07-27 ENCOUNTER — Encounter: Payer: Self-pay | Admitting: Family Medicine

## 2020-07-27 ENCOUNTER — Other Ambulatory Visit: Payer: Self-pay

## 2020-07-27 ENCOUNTER — Ambulatory Visit (INDEPENDENT_AMBULATORY_CARE_PROVIDER_SITE_OTHER): Payer: Medicare PPO | Admitting: Family Medicine

## 2020-07-27 VITALS — BP 128/70 | HR 71 | Temp 97.9°F | Resp 18 | Ht 60.0 in | Wt 136.6 lb

## 2020-07-27 DIAGNOSIS — E782 Mixed hyperlipidemia: Secondary | ICD-10-CM | POA: Diagnosis not present

## 2020-07-27 DIAGNOSIS — Z780 Asymptomatic menopausal state: Secondary | ICD-10-CM

## 2020-07-27 DIAGNOSIS — Z1231 Encounter for screening mammogram for malignant neoplasm of breast: Secondary | ICD-10-CM

## 2020-07-27 DIAGNOSIS — Z0000 Encounter for general adult medical examination without abnormal findings: Secondary | ICD-10-CM | POA: Diagnosis not present

## 2020-07-27 DIAGNOSIS — I10 Essential (primary) hypertension: Secondary | ICD-10-CM | POA: Diagnosis not present

## 2020-07-27 DIAGNOSIS — R7303 Prediabetes: Secondary | ICD-10-CM

## 2020-07-27 DIAGNOSIS — T2121XA Burn of second degree of chest wall, initial encounter: Secondary | ICD-10-CM

## 2020-07-27 DIAGNOSIS — M791 Myalgia, unspecified site: Secondary | ICD-10-CM

## 2020-07-27 DIAGNOSIS — T466X5A Adverse effect of antihyperlipidemic and antiarteriosclerotic drugs, initial encounter: Secondary | ICD-10-CM

## 2020-07-27 LAB — POCT GLYCOSYLATED HEMOGLOBIN (HGB A1C): Hemoglobin A1C: 5.7 % — AB (ref 4.0–5.6)

## 2020-07-27 MED ORDER — EZETIMIBE 10 MG PO TABS: 10.0000 mg | ORAL_TABLET | Freq: Every day | ORAL | 3 refills | Status: DC

## 2020-07-27 NOTE — Progress Notes (Signed)
Subjective  Chief Complaint  Patient presents with   Annual Exam    Non fasting labs   Health Maintenance    Patient stated that she will call to schedule her Mammorgram and Dexa Scan   Prediabetes   Hyperlipidemia   Hypertension    HPI: Kelli Mcintyre is a 69 y.o. female who presents to [facility] today for a Female Wellness Visit. She also has the concerns and/or needs as listed above in the chief complaint. These will be addressed in addition to the Health Maintenance Visit.   Wellness Visit: annual visit with health maintenance review and exam without Pap   Health maintenance: Patient has deferred her mammogram and bone density scan due to Covid.  She continues to be tentative about going out for elective reasons.  She is now on exam as well.  She complains of some dry eye symptoms.  No pain or photophobia.  Diet is good.  Tries to avoid starches.  Eats plenty of veggies.  Immunizations are up-to-date.  Colon cancer screening is up-to-date.   Chronic disease f/u and/or acute problem visit: (deemed necessary to be done in addition to the wellness visit):  Prediabetes follow up: She has been off Metformin for the last 3 months.  Diet is good.  Weight is stable.  She denies symptoms of hyperglycemia.  Hyperlipidemia intolerant to statins but would be willing to try Zetia.  Reviewed most recent lab test results, see below  Hypertension: Taking amlodipine 5 mg and losartan 100 daily.  No adverse effects.  Hypertension review of symptoms was fixing fatigue negative chest pain, shortness of breath, lower extremity edema, palpitations  Burn: Patient reports she was fixing hot tea for her husband about 2 weeks ago and spilled it on her self.  Sustained a burn to the right breast.  It is healing and no longer painful.  It did blister.  Immunization History  Administered Date(s) Administered   Influenza Split 08/25/2015, 08/30/2016   Influenza, High Dose  Seasonal PF 08/20/2018, 08/16/2019   Influenza,inj,Quad PF,6+ Mos 07/29/2017, 07/22/2020   Moderna SARS-COVID-2 Vaccination 12/29/2019, 01/31/2020   Pneumococcal Conjugate-13 12/26/2017   Pneumococcal Polysaccharide-23 12/26/2018   Tdap 12/26/2017   Zoster Recombinat (Shingrix) 02/10/2018, 06/08/2018    Diabetes Related Lab Review: Lab Results  Component Value Date   HGBA1C 5.7 (A) 07/27/2020   HGBA1C 5.7 (A) 04/25/2020   HGBA1C 6.1 06/28/2019    No results found for: Concepcion Elk Lab Results  Component Value Date   CREATININE 1.00 04/25/2020   BUN 20 04/25/2020   NA 138 04/25/2020   K 4.2 04/25/2020   CL 101 04/25/2020   CO2 29 04/25/2020   Lab Results  Component Value Date   CHOL 203 (H) 04/25/2020   CHOL 207 (H) 06/28/2019   CHOL 212 (H) 09/29/2018   Lab Results  Component Value Date   HDL 55.20 04/25/2020   HDL 57.50 06/28/2019   HDL 54.10 09/29/2018   Lab Results  Component Value Date   LDLCALC 114 (H) 04/25/2020   LDLCALC 130 (H) 06/28/2019   LDLCALC 125 (H) 09/29/2018   Lab Results  Component Value Date   TRIG 172.0 (H) 04/25/2020   TRIG 100.0 06/28/2019   TRIG 165.0 (H) 09/29/2018   Lab Results  Component Value Date   CHOLHDL 4 04/25/2020   CHOLHDL 4 06/28/2019   CHOLHDL 4 09/29/2018   No results found for: LDLDIRECT The 10-year ASCVD risk score Denman George DC  Montez HagemanJr., et al., 2013) is: 23%   Values used to calculate the score:     Age: [age] years     Sex: Female     Is Non-Hispanic African American: No     Diabetic: Yes     Tobacco smoker: No     Systolic Blood Pressure: 128 mmHg     Is BP treated: Yes     HDL Cholesterol: 55.2 mg/dL     Total Cholesterol: 203 mg/dL  BP Readings from Last 3 Encounters:  07/27/20 128/70  04/25/20 120/80  01/27/20 119/75   Wt Readings from Last 3 Encounters:  07/27/20 136 lb 9.6 oz (62 kg)  04/25/20 142 lb (64.4 kg)  01/27/20 140 lb (63.5 kg)    Health Maintenance  Topic Date Due   MAMMOGRAM   09/14/2020 (Originally 01/15/2020)   DEXA SCAN  09/14/2020 (Originally 03/14/2019)   COLONOSCOPY  05/18/2027   TETANUS/TDAP  12/27/2027   INFLUENZA VACCINE  Completed   COVID-19 Vaccine  Completed   PNA vac Low Risk Adult  Completed   Hepatitis C Screening  Discontinued     Assessment  1. Annual physical exam   2. Prediabetes   3. Mixed hyperlipidemia   4. Essential hypertension   5. Myalgia due to statin   6. Breast cancer screening by mammogram   7. Asymptomatic menopausal state   8. Partial thickness burn of right breast, initial encounter      Plan  Female Wellness Visit:  Age appropriate Health Maintenance and Prevention measures were discussed with patient. Included topics are cancer screening recommendations, ways to keep healthy (see AVS) including dietary and exercise recommendations, regular eye and dental care, use of seat belts, and avoidance of moderate alcohol use and tobacco use.  Counseling done on importance of mammogram screening.  Patient will think about scheduling.  Orders are placed.  Due for bone density screen as well.  BMI: discussed patient's BMI and encouraged positive lifestyle modifications to help get to or maintain a target BMI.  HM needs and immunizations were addressed and ordered. See below for orders. See HM and immunization section for updates.  Hands are up-to-date  Routine labs and screening tests ordered including cmp, cbc and lipids where appropriate.  Discussed recommendations regarding Vit D and calcium supplementation (see AVS)  Chronic disease management visit and/or acute problem visit:  Prediabetes: Continues to improve.  Near normal now.  Continue diet.  No medications indicated.  Recommend eye exam.  Hypertension: Well-controlled on current medications.  No changes made.  Renal electrolytes are stable.  Hyperlipidemia: Intolerant to statins.  Trial of Zetia nightly.  Recheck 6 months.  Second-degree burn: Fortunately  healing well.  Routine wound care, no other treatment indicated at this time.  Follow up: Return in about 6 months (around 01/24/2021) for follow up of prediabetes and hypertension.  Orders Placed This Encounter  Procedures   DG Bone Density   MM DIGITAL SCREENING BILATERAL   POCT HgB A1C   Meds ordered this encounter  Medications   ezetimibe (ZETIA) 10 MG tablet    Sig: Take 1 tablet (10 mg total) by mouth at bedtime.    Dispense:  90 tablet    Refill:  3      Lifestyle: Body mass index is 26.68 kg/m. Wt Readings from Last 3 Encounters:  07/27/20 136 lb 9.6 oz (62 kg)  04/25/20 142 lb (64.4 kg)  01/27/20 140 lb (63.5 kg)    Patient Active Problem List  Diagnosis Date Noted   Mixed hyperlipidemia 12/07/2018    Priority: High   Prediabetes 03/23/2018    Priority: High   Essential hypertension 06/15/2017    Priority: High   Lumbar spinal stenosis 01/27/2020    Priority: Medium   DJD (degenerative joint disease), lumbar 06/15/2017    Priority: Medium    MRI LUMBAR 06/29/17: 1. Lumbar facet arthropathy most advanced at L4-5 where there is anterolisthesis. Milder disc degeneration with generalized disc bulging. 2. L4-5 advanced spinal stenosis. Moderate right foraminal impingement. 3. Noncompressive degenerative changes described above.     Myalgia due to statin 06/28/2019    Priority: Low   Health Maintenance  Topic Date Due   MAMMOGRAM  09/14/2020 (Originally 01/15/2020)   DEXA SCAN  09/14/2020 (Originally 03/14/2019)   COLONOSCOPY  05/18/2027   TETANUS/TDAP  12/27/2027   INFLUENZA VACCINE  Completed   COVID-19 Vaccine  Completed   PNA vac Low Risk Adult  Completed   Hepatitis C Screening  Discontinued   Immunization History  Administered Date(s) Administered   Influenza Split 08/25/2015, 08/30/2016   Influenza, High Dose Seasonal PF 08/20/2018, 08/16/2019   Influenza,inj,Quad PF,6+ Mos 07/29/2017, 07/22/2020   Moderna SARS-COVID-2  Vaccination 12/29/2019, 01/31/2020   Pneumococcal Conjugate-13 12/26/2017   Pneumococcal Polysaccharide-23 12/26/2018   Tdap 12/26/2017   Zoster Recombinat (Shingrix) 02/10/2018, 06/08/2018   We updated and reviewed the patient's past history in detail and it is documented below. Allergies: Patient is allergic to lipitor [atorvastatin calcium]. Past Medical History Patient  has a past medical history of Hyperlipemia, Hypertension, Lumbar spinal stenosis (01/27/2020), and Urinary incontinence. Past Surgical History Patient  has a past surgical history that includes Cataract extraction (N/A) and Tubal ligation. Family History: Patient family history includes Diabetes in her mother; Hypertension in her mother. Social History:  Patient  reports that she has never smoked. She has never used smokeless tobacco. She reports that she does not drink alcohol and does not use drugs.  Review of Systems: Constitutional: negative for fever or malaise Ophthalmic: negative for photophobia, double vision or loss of vision Cardiovascular: negative for chest pain, dyspnea on exertion, or new LE swelling Respiratory: negative for SOB or persistent cough Gastrointestinal: negative for abdominal pain, change in bowel habits or melena Genitourinary: negative for dysuria or gross hematuria, no abnormal uterine bleeding or disharge Musculoskeletal: negative for new gait disturbance or muscular weakness Integumentary: negative for new or persistent rashes, no breast lumps Neurological: negative for TIA or stroke symptoms Psychiatric: negative for SI or delusions Allergic/Immunologic: negative for hives  Patient Care Team    Relationship Specialty Notifications Start End  Willow Ora, MD PCP - General Family Medicine  01/27/20     Objective  Vitals: BP 128/70    Pulse 71    Temp 97.9 F (36.6 C) (Temporal)    Resp 18    Ht 5' (1.524 m)    Wt 136 lb 9.6 oz (62 kg)    SpO2 95%    BMI 26.68 kg/m   General:  Well developed, well nourished, no acute distress  Psych:  Alert and orientedx3,normal mood and affect HEENT:  Normocephalic, atraumatic, non-icteric sclera,  supple neck without adenopathy, mass or thyromegaly Cardiovascular:  Normal S1, S2, RRR without gallop, rub or murmur Respiratory:  Good breath sounds bilaterally, CTAB with normal respiratory effort Gastrointestinal: normal bowel sounds, soft, non-tender, no noted masses. No HSM MSK: no deformities, contusions. Joints are without erythema or swelling.  Skin:  Warm, no rashes or  suspicious lesions noted Neurologic:    Mental status is normal. CN 2-11 are normal. Gross motor and sensory exams are normal. Normal gait. No tremor Breast Exam: No mass, skin retraction or nipple discharge is appreciated in either breast.  Right breast with healing burn above the right areola.  No axillary adenopathy. Fibrocystic changes are not noted   Commons side effects, risks, benefits, and alternatives for medications and treatment plan prescribed today were discussed, and the patient expressed understanding of the given instructions. Patient is instructed to call or message via MyChart if he/she has any questions or concerns regarding our treatment plan. No barriers to understanding were identified. We discussed Red Flag symptoms and signs in detail. Patient expressed understanding regarding what to do in case of urgent or emergency type symptoms.   Medication list was reconciled, printed and provided to the patient in AVS. Patient instructions and summary information was reviewed with the patient as documented in the AVS. This note was prepared with assistance of Dragon voice recognition software. Occasional wrong-word or sound-a-like substitutions may have occurred due to the inherent limitations of voice recognition software  This visit occurred during the SARS-CoV-2 public health emergency.  Safety protocols were in place, including screening  questions prior to the visit, additional usage of staff PPE, and extensive cleaning of exam room while observing appropriate contact time as indicated for disinfecting solutions.

## 2020-07-27 NOTE — Patient Instructions (Addendum)
Please return in 6 months to recheck sugars and blood pressure Your sugar test is now near normal! Good work. Keep eating plenty of vegetables and avoid the starches and sweets.  Keep taking your blood pressure medications.  Start the new medication nightly to help lower your cholesterol.  Schedule an eye exam when you can.   If you have any questions or concerns, please don't hesitate to send me a message via MyChart or call the office at [phone]. Thank you for visiting with [place] today! It's our pleasure caring for you.  I have ordered a mammogram and/or bone density for you as we discussed today: [x]   Mammogram  [x]   Bone Density  Please call the office checked below to schedule your appointment: Your appointment will at the following location  [x]   [facility]      [address] [place],        [address]         []   [facility]  [address] [place],  [facility]

## 2021-01-28 ENCOUNTER — Other Ambulatory Visit: Payer: Self-pay

## 2021-01-28 ENCOUNTER — Other Ambulatory Visit: Payer: Self-pay | Admitting: Family Medicine

## 2021-01-28 ENCOUNTER — Encounter: Payer: Self-pay | Admitting: Family Medicine

## 2021-01-28 ENCOUNTER — Ambulatory Visit (INDEPENDENT_AMBULATORY_CARE_PROVIDER_SITE_OTHER): Payer: Medicare PPO | Admitting: Family Medicine

## 2021-01-28 VITALS — BP 138/80 | HR 75 | Temp 97.7°F | Resp 17 | Ht 60.0 in | Wt 145.6 lb

## 2021-01-28 DIAGNOSIS — R7303 Prediabetes: Secondary | ICD-10-CM

## 2021-01-28 DIAGNOSIS — Z780 Asymptomatic menopausal state: Secondary | ICD-10-CM

## 2021-01-28 DIAGNOSIS — Z1231 Encounter for screening mammogram for malignant neoplasm of breast: Secondary | ICD-10-CM

## 2021-01-28 DIAGNOSIS — E782 Mixed hyperlipidemia: Secondary | ICD-10-CM | POA: Diagnosis not present

## 2021-01-28 DIAGNOSIS — M791 Myalgia, unspecified site: Secondary | ICD-10-CM

## 2021-01-28 DIAGNOSIS — I10 Essential (primary) hypertension: Secondary | ICD-10-CM | POA: Diagnosis not present

## 2021-01-28 DIAGNOSIS — T466X5A Adverse effect of antihyperlipidemic and antiarteriosclerotic drugs, initial encounter: Secondary | ICD-10-CM

## 2021-01-28 LAB — LIPID PANEL
Cholesterol: 181 mg/dL (ref 0–200)
HDL: 55.6 mg/dL (ref >39.00)
NonHDL: 124.95
Total CHOL/HDL Ratio: 3
Triglycerides: 270 mg/dL — ABNORMAL HIGH (ref 0.0–149.0)
VLDL: 54 mg/dL — ABNORMAL HIGH (ref 0.0–40.0)

## 2021-01-28 LAB — COMPREHENSIVE METABOLIC PANEL
ALT: 20 U/L (ref 0–35)
AST: 21 U/L (ref 0–37)
Albumin: 4 g/dL (ref 3.5–5.2)
Alkaline Phosphatase: 47 U/L (ref 39–117)
BUN: 18 mg/dL (ref 6–23)
CO2: 30 mEq/L (ref 19–32)
Calcium: 9.5 mg/dL (ref 8.4–10.5)
Chloride: 104 mEq/L (ref 96–112)
Creatinine, Ser: 1.02 mg/dL (ref 0.40–1.20)
GFR: 55.6 mL/min — ABNORMAL LOW (ref >60.00)
Glucose, Bld: 93 mg/dL (ref 70–99)
Potassium: 4 mEq/L (ref 3.5–5.1)
Sodium: 140 mEq/L (ref 135–145)
Total Bilirubin: 0.6 mg/dL (ref 0.2–1.2)
Total Protein: 7.3 g/dL (ref 6.0–8.3)

## 2021-01-28 LAB — LDL CHOLESTEROL, DIRECT: Direct LDL: 96 mg/dL

## 2021-01-28 LAB — HEMOGLOBIN A1C: Hgb A1c MFr Bld: 6.3 % (ref 4.6–6.5)

## 2021-01-28 MED ORDER — AMLODIPINE BESYLATE 5 MG PO TABS: 5.0000 mg | ORAL_TABLET | Freq: Every day | ORAL | 3 refills | Status: DC

## 2021-01-28 MED ORDER — LOSARTAN POTASSIUM 100 MG PO TABS: 100.0000 mg | ORAL_TABLET | Freq: Every day | ORAL | 3 refills | Status: DC

## 2021-01-28 NOTE — Patient Instructions (Addendum)
Please return in 6 months for your annual complete physical; please come fasting.  I will release your lab results to you on your MyChart account with further instructions. Please reply with any questions.   I have ordered a mammogram and/or bone density for you as we discussed today: [x]   Mammogram  [x]   Bone Density  Please call the office checked below to schedule your appointment:  [x]   [facility]      [address] [place],        [address] Wing         []   [facility]  [address] [place],  [facility]   If you have any questions or concerns, please don't hesitate to send me a message via MyChart or call the office at [phone]. Thank you for visiting with [place] today! It's our pleasure caring for you.

## 2021-01-28 NOTE — Progress Notes (Signed)
Subjective  CC:  Chief Complaint  Patient presents with  . Hyperglycemia  . Hyperlipidemia  . Hypertension    HPI: Kelli Mcintyre is a 69 y.o. female who presents to the office today to address the problems listed above in the chief complaint.  Hypertension f/u: Control is good . Pt reports she is doing well. taking medications as instructed, no medication side effects noted, no TIAs, no chest pain on exertion, no dyspnea on exertion, no swelling of ankles.  She takes losartan 100 daily amlodipine 5 mg daily.  Denies adverse effects from his BP medications. Compliance with medication is good.   Hyperlipidemia intolerant to statins: Tolerating Zetia 10 mg nightly without side effects.  Trying to eat well but because she has been home more due to Covid restrictions she feels like she eats more and has gained some weight back.  Prediabetes: Denies symptoms of hyperglycemia.  Health maintenance: Immunizations are up-to-date.  Overdue for mammogram and bone density.  Assessment  1. Prediabetes   2. Essential hypertension   3. Mixed hyperlipidemia   4. Myalgia due to statin   5. Encounter for screening mammogram for breast cancer   6. Asymptomatic postmenopausal state      Plan    Hypertension f/u: BP control is well controlled.  Refill losartan 100 mg daily and amlodipine 5 mg daily.  Check renal function electrolytes  Hyperlipidemia f/u: Tolerating Zetia.  Recheck lipid panel today.  Nonfasting.  Prediabetes: Check A1c.  Recommend healthy diet.  Recommend increasing activity.  Ordered mammogram and bone density.  Educated on need.  Patient will schedule Education regarding management of these chronic disease states was given. Management strategies discussed on successive visits include dietary and exercise recommendations, goals of achieving and maintaining IBW, and lifestyle modifications aiming for adequate sleep and minimizing stressors.   Follow up: 6 months for complete  physical.  Orders Placed This Encounter  Procedures  . MM DIGITAL SCREENING BILATERAL  . DG Bone Density  . Comprehensive metabolic panel  . Hemoglobin A1c  . Lipid panel   Meds ordered this encounter  Medications  . amLODipine (NORVASC) 5 MG tablet    Sig: Take 1 tablet (5 mg total) by mouth daily.    Dispense:  90 tablet    Refill:  3  . losartan (COZAAR) 100 MG tablet    Sig: Take 1 tablet (100 mg total) by mouth daily.    Dispense:  90 tablet    Refill:  3      BP Readings from Last 3 Encounters:  01/28/21 138/80  07/27/20 128/70  04/25/20 120/80   Wt Readings from Last 3 Encounters:  01/28/21 145 lb 9.6 oz (66 kg)  07/27/20 136 lb 9.6 oz (62 kg)  04/25/20 142 lb (64.4 kg)    Lab Results  Component Value Date   CHOL 203 (H) 04/25/2020   CHOL 207 (H) 06/28/2019   CHOL 212 (H) 09/29/2018   Lab Results  Component Value Date   HDL 55.20 04/25/2020   HDL 57.50 06/28/2019   HDL 54.10 09/29/2018   Lab Results  Component Value Date   LDLCALC 114 (H) 04/25/2020   LDLCALC 130 (H) 06/28/2019   LDLCALC 125 (H) 09/29/2018   Lab Results  Component Value Date   TRIG 172.0 (H) 04/25/2020   TRIG 100.0 06/28/2019   TRIG 165.0 (H) 09/29/2018   Lab Results  Component Value Date   CHOLHDL 4 04/25/2020   CHOLHDL 4 06/28/2019  CHOLHDL 4 09/29/2018   No results found for: LDLDIRECT Lab Results  Component Value Date   CREATININE 1.00 04/25/2020   BUN 20 04/25/2020   NA 138 04/25/2020   K 4.2 04/25/2020   CL 101 04/25/2020   CO2 29 04/25/2020    The 10-year ASCVD risk score Denman George DC Jr., et al., 2013) is: 26.3%   Values used to calculate the score:     Age: 53 years     Sex: Female     Is Non-Hispanic African American: No     Diabetic: Yes     Tobacco smoker: No     Systolic Blood Pressure: 138 mmHg     Is BP treated: Yes     HDL Cholesterol: 55.2 mg/dL     Total Cholesterol: 203 mg/dL  I reviewed the patients updated PMH, FH, and SocHx.    Patient  Active Problem List   Diagnosis Date Noted  . Mixed hyperlipidemia 12/07/2018    Priority: High  . Prediabetes 03/23/2018    Priority: High  . Essential hypertension 06/15/2017    Priority: High  . Lumbar spinal stenosis 01/27/2020    Priority: Medium  . DJD (degenerative joint disease), lumbar 06/15/2017    Priority: Medium  . Myalgia due to statin 06/28/2019    Priority: Low    Allergies: Lipitor [atorvastatin calcium]  Social History: Patient  reports that she has never smoked. She has never used smokeless tobacco. She reports that she does not drink alcohol and does not use drugs.  Current Meds  Medication Sig  . Ascorbic Acid (VITAMIN C) 1000 MG tablet Take 1,000 mg by mouth daily.  . Cholecalciferol (VITAMIN D3) 50 MCG (2000 UT) TABS Take 50 mcg by mouth daily.  . Cyanocobalamin (VITAMIN B12) 3000 MCG SUBL Take 3,000 mcg by mouth daily.  Marland Kitchen ezetimibe (ZETIA) 10 MG tablet Take 1 tablet (10 mg total) by mouth at bedtime.  . Magnesium 250 MG TABS Take 250 mg by mouth daily.  Gifford Shave Omega-3 Krill Oil 500 MG CAPS Take 500 mg by mouth daily.  . Multiple Vitamin (MULTIVITAMIN) tablet Take 1 tablet by mouth daily.  . Omega-3 Fatty Acids (FISH OIL) 1200 MG CAPS Take 1,200 mg by mouth daily.  . Zinc 30 MG TABS Take 30 mg by mouth daily.  . [DISCONTINUED] amLODipine (NORVASC) 5 MG tablet TAKE 1 TABLET BY MOUTH EVERY DAY  . [DISCONTINUED] losartan (COZAAR) 100 MG tablet TAKE 1 TABLET BY MOUTH EVERY DAY    Review of Systems: Cardiovascular: negative for chest pain, palpitations, leg swelling, orthopnea Respiratory: negative for SOB, wheezing or persistent cough Gastrointestinal: negative for abdominal pain Genitourinary: negative for dysuria or gross hematuria  Objective  Vitals: BP 138/80   Pulse 75   Temp 97.7 F (36.5 C) (Temporal)   Resp 17   Ht 5' (1.524 m)   Wt 145 lb 9.6 oz (66 kg)   SpO2 97%   BMI 28.44 kg/m  General: no acute distress  Psych:  Alert and  oriented, normal mood and affect HEENT:  Normocephalic, atraumatic, supple neck  Cardiovascular:  RRR 2/6 systolic murmur, no edema  respiratory:  Good breath sounds bilaterally, CTAB with normal respiratory effort   Commons side effects, risks, benefits, and alternatives for medications and treatment plan prescribed today were discussed, and the patient expressed understanding of the given instructions. Patient is instructed to call or message via MyChart if he/she has any questions or concerns regarding our treatment plan. No  barriers to understanding were identified. We discussed Red Flag symptoms and signs in detail. Patient expressed understanding regarding what to do in case of urgent or emergency type symptoms.   Medication list was reconciled, printed and provided to the patient in AVS. Patient instructions and summary information was reviewed with the patient as documented in the AVS. This note was prepared with assistance of Dragon voice recognition software. Occasional wrong-word or sound-a-like substitutions may have occurred due to the inherent limitations of voice recognition software  This visit occurred during the SARS-CoV-2 public health emergency.  Safety protocols were in place, including screening questions prior to the visit, additional usage of staff PPE, and extensive cleaning of exam room while observing appropriate contact time as indicated for disinfecting solutions.

## 2021-02-04 NOTE — Progress Notes (Signed)
Please call patient: I have reviewed his/her lab results. Lab results look good although sugar test is higher. Eat less sweets, sugar, sweetened beverages etc and I will recheck in 6 months. No other changes needed at this time

## 2021-02-11 ENCOUNTER — Telehealth: Payer: Self-pay | Admitting: Family Medicine

## 2021-02-11 NOTE — Telephone Encounter (Signed)
Left message for patient to call back and schedule Medicare Annual Wellness Visit (AWV) either virtually OR in office.   Last AWV 06/28/19; please schedule at anytime with [facility]Nurse Health Advisor at [facility].  This should be a 45 minute visit.

## 2021-02-13 ENCOUNTER — Other Ambulatory Visit: Payer: Self-pay | Admitting: Family Medicine

## 2021-02-13 DIAGNOSIS — Z780 Asymptomatic menopausal state: Secondary | ICD-10-CM

## 2021-04-05 ENCOUNTER — Ambulatory Visit
Admission: RE | Admit: 2021-04-05 | Discharge: 2021-04-05 | Disposition: A | Discharge status: Home / Self Care | Payer: Medicare PPO | Source: Ambulatory Visit | Attending: Family Medicine | Admitting: Family Medicine

## 2021-04-05 ENCOUNTER — Other Ambulatory Visit: Payer: Self-pay

## 2021-04-05 DIAGNOSIS — Z1231 Encounter for screening mammogram for malignant neoplasm of breast: Secondary | ICD-10-CM

## 2021-05-24 ENCOUNTER — Other Ambulatory Visit: Payer: Self-pay

## 2021-05-24 ENCOUNTER — Ambulatory Visit
Admission: RE | Admit: 2021-05-24 | Discharge: 2021-05-24 | Disposition: A | Discharge status: Home / Self Care | Payer: Medicare PPO | Source: Ambulatory Visit | Attending: Family Medicine | Admitting: Family Medicine

## 2021-05-24 DIAGNOSIS — Z780 Asymptomatic menopausal state: Secondary | ICD-10-CM

## 2021-05-24 DIAGNOSIS — M85851 Other specified disorders of bone density and structure, right thigh: Secondary | ICD-10-CM | POA: Diagnosis not present

## 2021-06-12 ENCOUNTER — Encounter: Payer: Self-pay | Admitting: Family Medicine

## 2021-07-01 DIAGNOSIS — Z20822 Contact with and (suspected) exposure to covid-19: Secondary | ICD-10-CM | POA: Diagnosis not present

## 2021-07-03 ENCOUNTER — Telehealth (INDEPENDENT_AMBULATORY_CARE_PROVIDER_SITE_OTHER): Payer: Medicare PPO | Admitting: Physician Assistant

## 2021-07-03 DIAGNOSIS — U071 COVID-19: Secondary | ICD-10-CM

## 2021-07-03 MED ORDER — MOLNUPIRAVIR EUA 200MG CAPSULE: 4.0000 | ORAL_CAPSULE | Freq: Two times a day (BID) | ORAL | 0 refills | Status: AC

## 2021-07-03 NOTE — Patient Instructions (Signed)
I'm sorry you are not feeling well. Please take the [facility] for treatment of COVID-19 as directed. This may cause dizziness or diarrhea. In the case of diarrhea, over the counter Imodium may be helpful. Be sure to stay well hydrated and rest as needed. Go to the Emergency department in the case of severe weakness, chest pain, or shortness of breath.

## 2021-07-03 NOTE — Progress Notes (Signed)
Virtual Visit via Video Note  I connected with  Kelli Mcintyre  on 07/03/21 at  9:00 AM EDT by a video enabled telemedicine application and verified that I am speaking with the correct person using two identifiers.  Location: Patient: home in [place] Provider: Nature conservation officer at [employer] Persons present: Patient, her husband, and myself   I discussed the limitations of evaluation and management by telemedicine and the availability of in person appointments. The patient expressed understanding and agreed to proceed.   History of Present Illness: Chief complaint: COVID+ 07/02/21 Symptom onset: 06/30/21 Pertinent positives: Cough, chills, ST  Pertinent negatives: Chest pain, shortness of breath  Treatments tried: Tylenol, Nyquil, Mucinex, & Dayquil Vaccine status: Moderna x2 Sick exposure: Unsure, husband has similar symptoms     Observations/Objective:   Gen: Awake, alert, no acute distress Resp: Breathing is even and non-labored Psych: calm/pleasant demeanor Neuro: Alert and Oriented x 3, + facial symmetry, speech is clear.   Assessment and Plan: Diagnosis confirmed via home antigen test.  We discussed current algorithm recommendations for prescribing outpatient antivirals.  As the patient is over the age of 60, antiviral treatment is indicated at this time.  Risks versus benefits discussed. Decided together to start on Molnupiravir at this time, possible side effects of diarrhea and dizziness discussed. Advised self-isolation at home for the next 5 days and then masking around others for at least an additional 5 days.  Treat supportively at this time as well including sleeping prone, deep breathing exercises, pushing fluids, walking every few hours, vitamins C and D, and Tylenol or ibuprofen as needed.  The patient understands that COVID-19 illness can wax and wane.  Should the symptoms acutely worsen or patient starts to experience sudden shortness of breath, chest pain, severe  weakness, the patient will go straight to the emergency department.  Also advised home pulse oximetry monitoring and for any reading consistently under 92%, should also report to the emergency department.  The patient will continue to keep [place] updated.   Follow Up Instructions:    I discussed the assessment and treatment plan with the patient. The patient was provided an opportunity to ask questions and all were answered. The patient agreed with the plan and demonstrated an understanding of the instructions.   The patient was advised to call back or seek an in-person evaluation if the symptoms worsen or if the condition fails to improve as anticipated.  Masin Shatto M Fayrene Towner, PA-C

## 2021-07-05 ENCOUNTER — Other Ambulatory Visit: Payer: Self-pay | Admitting: Physician Assistant

## 2021-07-05 ENCOUNTER — Telehealth: Payer: Self-pay

## 2021-07-05 MED ORDER — BENZONATATE 100 MG PO CAPS: 100.0000 mg | ORAL_CAPSULE | Freq: Three times a day (TID) | ORAL | 0 refills | Status: DC | PRN

## 2021-07-05 NOTE — Telephone Encounter (Signed)
Patient wanted to see if alyssa can send this in since she did a virtual with the patient on /07/03/21.

## 2021-07-05 NOTE — Telephone Encounter (Signed)
Please advise 

## 2021-07-05 NOTE — Telephone Encounter (Signed)
Patient's husband is calling in stating they had a virtual visit on 8/17 for covid. Kelli Mcintyre is requesting a cough prescription as she has been trying over the counter cough medicine and nothing is helping.

## 2021-07-05 NOTE — Telephone Encounter (Signed)
Patient aware that medication sent to [facility] [address]

## 2021-07-12 ENCOUNTER — Other Ambulatory Visit: Payer: Self-pay | Admitting: Family Medicine

## 2021-08-05 ENCOUNTER — Encounter: Payer: Medicare PPO | Admitting: Family Medicine

## 2021-08-12 DIAGNOSIS — I10 Essential (primary) hypertension: Secondary | ICD-10-CM | POA: Diagnosis not present

## 2021-08-12 DIAGNOSIS — Z20822 Contact with and (suspected) exposure to covid-19: Secondary | ICD-10-CM | POA: Diagnosis not present

## 2021-08-12 DIAGNOSIS — Z7982 Long term (current) use of aspirin: Secondary | ICD-10-CM | POA: Diagnosis not present

## 2021-08-12 DIAGNOSIS — E785 Hyperlipidemia, unspecified: Secondary | ICD-10-CM | POA: Diagnosis not present

## 2021-08-12 DIAGNOSIS — Z833 Family history of diabetes mellitus: Secondary | ICD-10-CM | POA: Diagnosis not present

## 2021-08-19 ENCOUNTER — Telehealth: Payer: Self-pay

## 2021-08-19 ENCOUNTER — Other Ambulatory Visit: Payer: Self-pay

## 2021-08-19 ENCOUNTER — Ambulatory Visit (INDEPENDENT_AMBULATORY_CARE_PROVIDER_SITE_OTHER): Payer: Medicare PPO | Admitting: Family Medicine

## 2021-08-19 ENCOUNTER — Encounter: Payer: Self-pay | Admitting: Family Medicine

## 2021-08-19 VITALS — BP 120/80 | HR 87 | Temp 98.7°F | Ht 60.0 in | Wt 144.0 lb

## 2021-08-19 DIAGNOSIS — G729 Myopathy, unspecified: Secondary | ICD-10-CM | POA: Diagnosis not present

## 2021-08-19 DIAGNOSIS — R7989 Other specified abnormal findings of blood chemistry: Secondary | ICD-10-CM | POA: Diagnosis not present

## 2021-08-19 DIAGNOSIS — M8580 Other specified disorders of bone density and structure, unspecified site: Secondary | ICD-10-CM | POA: Diagnosis not present

## 2021-08-19 DIAGNOSIS — I10 Essential (primary) hypertension: Secondary | ICD-10-CM | POA: Diagnosis not present

## 2021-08-19 DIAGNOSIS — M791 Myalgia, unspecified site: Secondary | ICD-10-CM | POA: Diagnosis not present

## 2021-08-19 DIAGNOSIS — Z8616 Personal history of COVID-19: Secondary | ICD-10-CM

## 2021-08-19 DIAGNOSIS — E782 Mixed hyperlipidemia: Secondary | ICD-10-CM | POA: Diagnosis not present

## 2021-08-19 DIAGNOSIS — T466X5A Adverse effect of antihyperlipidemic and antiarteriosclerotic drugs, initial encounter: Secondary | ICD-10-CM

## 2021-08-19 DIAGNOSIS — Z0000 Encounter for general adult medical examination without abnormal findings: Secondary | ICD-10-CM | POA: Diagnosis not present

## 2021-08-19 DIAGNOSIS — Z23 Encounter for immunization: Secondary | ICD-10-CM | POA: Diagnosis not present

## 2021-08-19 DIAGNOSIS — N3281 Overactive bladder: Secondary | ICD-10-CM

## 2021-08-19 DIAGNOSIS — R7303 Prediabetes: Secondary | ICD-10-CM | POA: Diagnosis not present

## 2021-08-19 HISTORY — DX: Overactive bladder: N32.81

## 2021-08-19 LAB — POCT GLYCOSYLATED HEMOGLOBIN (HGB A1C): Hemoglobin A1C: 6 % — AB (ref 4.0–5.6)

## 2021-08-19 NOTE — Patient Instructions (Signed)
Please return in 6 months for hypertension and prediabetes follow up.   I will release your lab results to you on your MyChart account with further instructions. Please reply with any questions.    Keep monitoring your blood pressure at home. We want it < 140/90.  If you have any questions or concerns, please don't hesitate to send me a message via MyChart or call the office at [phone]. Thank you for visiting with [place] today! It's our pleasure caring for you.

## 2021-08-19 NOTE — Progress Notes (Signed)
Subjective  Chief Complaint  Patient presents with   Annual Exam   Hyperlipidemia   Diabetes   Hypertension    HPI: Kelli Mcintyre is a 70 y.o. female who presents to [facility] [place] today for a Female Wellness Visit. She also has the concerns and/or needs as listed above in the chief complaint. These will be addressed in addition to the Health Maintenance Visit.   Wellness Visit: annual visit with health maintenance review and exam without Pap  HM: mammo up to date. Reviewed dexa: 05/2021: osteopenia. Healthy lifestyle. Eligibel for covid #4 and flu vaccine.  Chronic disease f/u and/or acute problem visit: (deemed necessary to be done in addition to the wellness visit): HTN: on amlidipine 5, losartan 100 with home readings 120/80s. Feeling well. Taking medications w/o adverse effects. No symptoms of CHF, angina; no palpitations, sob, cp or lower extremity edema. Compliant with meds.  HLD on zetia and tolerating it. Intolerant to statins due to myopathy. Failed lipitor. Resistant to rechallenge.  Prediabetes: no sxs of hyperglycemia. Eye exam is up to date. Sees Dr. Luciana Axe.  Personal history of covid last months; did well.  Ros + nocturia and urinary urgency. No dysuria. No gross hematuria. Mild sxs. Gets up 2-3x/night but not always.   Assessment  1. Annual physical exam   2. Prediabetes   3. Mixed hyperlipidemia   4. Essential hypertension   5. Osteopenia, unspecified location   6. Myalgia due to statin   7. Myopathy, unspecified   8. Personal history of COVID-19   9. Overactive bladder      Plan  Female Wellness Visit: Age appropriate Health Maintenance and Prevention measures were discussed with patient. Included topics are cancer screening recommendations, ways to keep healthy (see AVS) including dietary and exercise recommendations, regular eye and dental care, use of seat belts, and avoidance of moderate alcohol use and tobacco use.  BMI: discussed  patient's BMI and encouraged positive lifestyle modifications to help get to or maintain a target BMI. HM needs and immunizations were addressed and ordered. See below for orders. See HM and immunization section for updates. Flu today; had covid last month. Can get booster anytime now.  Routine labs and screening tests ordered including cmp, cbc and lipids where appropriate. Discussed recommendations regarding Vit D and calcium supplementation (see AVS)  Chronic disease management visit and/or acute problem visit: HTN: borderline control due to elevated diastolic. Will monitor. Recheck 6 months. No med changes today. Continue meds as listed above. Check renal function and electrolytes. HLD on zetia for recheck. Declines statin; myopathy with lipitor Prediabetes; very stable. Continue healhty diet and activity Osteopeneia; ca/d and exericse OAB: educated. Pt defers meds at this time. Monitor.   Follow up: 6 mo for bp and prediabetes recheck.   Orders Placed This Encounter  Procedures   CBC with Differential/Platelet   Comprehensive metabolic panel   Lipid panel   TSH   POCT HgB A1C   No orders of the defined types were placed in this encounter.     Body mass index is 28.12 kg/m. Wt Readings from Last 3 Encounters:  08/19/21 144 lb (65.3 kg)  01/28/21 145 lb 9.6 oz (66 kg)  07/27/20 136 lb 9.6 oz (62 kg)     Patient Active Problem List   Diagnosis Date Noted   Mixed hyperlipidemia 12/07/2018    Priority: 1.   Prediabetes 03/23/2018    Priority: 1.   Essential hypertension 06/15/2017  Priority: 1.   Lumbar spinal stenosis 01/27/2020    Priority: 2.   Osteopenia 03/23/2018    Priority: 2.    Dexa 05/2021: lowest T = -1.1; recheck 2-3 years    DJD (degenerative joint disease), lumbar 06/15/2017    Priority: 2.    MRI LUMBAR 06/29/17: 1. Lumbar facet arthropathy most advanced at L4-5 where there is anterolisthesis. Milder disc degeneration with generalized disc  bulging. 2. L4-5 advanced spinal stenosis. Moderate right foraminal impingement. 3. Noncompressive degenerative changes described above.      Myalgia due to statin 06/28/2019    Priority: 3.   Overactive bladder 08/19/2021    Mild; monitoring 08/2021    Health Maintenance  Topic Date Due   INFLUENZA VACCINE  06/17/2021   COVID-19 Vaccine (4 - Booster for Moderna series) 07/15/2021   MAMMOGRAM  04/05/2022   DEXA SCAN  05/24/2024   COLONOSCOPY (Pts 45-49yrs Insurance coverage will need to be confirmed)  05/18/2027   TETANUS/TDAP  12/27/2027   Zoster Vaccines- Shingrix  Completed   HPV VACCINES  Aged Out   Hepatitis C Screening  Discontinued   Immunization History  Administered Date(s) Administered   Hepatitis A, Adult 11/23/2013, 05/23/2014   Influenza Split 08/25/2015, 08/30/2016   Influenza, High Dose Seasonal PF 08/20/2018, 08/16/2019   Influenza,inj,Quad PF,6+ Mos 07/29/2017, 07/22/2020   Influenza-Unspecified 09/15/2012, 08/11/2013, 08/21/2014   Moderna Sars-Covid-2 Vaccination 12/29/2019, 01/31/2020, 03/15/2021   Pneumococcal Conjugate-13 10/30/2014, 12/26/2017   Pneumococcal Polysaccharide-23 12/26/2018   Tdap 12/26/2017   Zoster Recombinat (Shingrix) 02/10/2018, 06/08/2018   Zoster, Live 07/26/2013   We updated and reviewed the patient's past history in detail and it is documented below. Allergies: Patient is allergic to lipitor [atorvastatin calcium]. Past Medical History Patient  has a past medical history of Hyperlipemia, Hypertension, Lumbar spinal stenosis (01/27/2020), Overactive bladder (08/19/2021), and Urinary incontinence. Past Surgical History Patient  has a past surgical history that includes Cataract extraction (N/A) and Tubal ligation. Family History: Patient family history includes Diabetes in her mother; Hypertension in her mother. Social History:  Patient  reports that she has never smoked. She has never used smokeless tobacco. She reports that she  does not drink alcohol and does not use drugs.  Review of Systems: Constitutional: negative for fever or malaise Ophthalmic: negative for photophobia, double vision or loss of vision Cardiovascular: negative for chest pain, dyspnea on exertion, or new LE swelling Respiratory: negative for SOB or persistent cough Gastrointestinal: negative for abdominal pain, change in bowel habits or melena Genitourinary: negative for dysuria or gross hematuria, no abnormal uterine bleeding or disharge Musculoskeletal: negative for new gait disturbance or muscular weakness Integumentary: negative for new or persistent rashes, no breast lumps Neurological: negative for TIA or stroke symptoms Psychiatric: negative for SI or delusions Allergic/Immunologic: negative for hives  Patient Care Team    Relationship Specialty Notifications Start End  Willow Ora, MD PCP - General Family Medicine  01/27/20     Objective  Vitals: BP 120/80 Comment: by home readings  Pulse 87   Temp 98.7 F (37.1 C) (Temporal)   Ht 5' (1.524 m)   Wt 144 lb (65.3 kg)   SpO2 96%   BMI 28.12 kg/m  General:  Well developed, well nourished, no acute distress  Psych:  Alert and orientedx3,normal mood and affect HEENT:  Normocephalic, atraumatic, non-icteric sclera,  supple neck without adenopathy, mass or thyromegaly Cardiovascular:  Normal S1, S2, RRR without gallop, rub + soft systolic murmur Respiratory:  Good breath  sounds bilaterally, CTAB with normal respiratory effort Gastrointestinal: normal bowel sounds, soft, non-tender, no noted masses. No HSM MSK: no deformities, contusions. Joints are without erythema or swelling.  Skin:  Warm, no rashes or suspicious lesions noted Neurologic:    Mental status is normal. CN 2-11 are normal. Gross motor and sensory exams are normal. Normal gait. No tremor   Commons side effects, risks, benefits, and alternatives for medications and treatment plan prescribed today were discussed,  and the patient expressed understanding of the given instructions. Patient is instructed to call or message via MyChart if he/she has any questions or concerns regarding our treatment plan. No barriers to understanding were identified. We discussed Red Flag symptoms and signs in detail. Patient expressed understanding regarding what to do in case of urgent or emergency type symptoms.  Medication list was reconciled, printed and provided to the patient in AVS. Patient instructions and summary information was reviewed with the patient as documented in the AVS. This note was prepared with assistance of Dragon voice recognition software. Occasional wrong-word or sound-a-like substitutions may have occurred due to the inherent limitations of voice recognition software  This visit occurred during the SARS-CoV-2 public health emergency.  Safety protocols were in place, including screening questions prior to the visit, additional usage of staff PPE, and extensive cleaning of exam room while observing appropriate contact time as indicated for disinfecting solutions.

## 2021-08-20 LAB — CBC WITH DIFFERENTIAL/PLATELET
Basophils Absolute: 0 10*3/uL (ref 0.0–0.1)
Basophils Relative: 0.7 % (ref 0.0–3.0)
Eosinophils Absolute: 0.1 10*3/uL (ref 0.0–0.7)
Eosinophils Relative: 1.3 % (ref 0.0–5.0)
HCT: 39.6 % (ref 36.0–46.0)
Hemoglobin: 13.2 g/dL (ref 12.0–15.0)
Lymphocytes Relative: 28.9 % (ref 12.0–46.0)
Lymphs Abs: 2 10*3/uL (ref 0.7–4.0)
MCHC: 33.3 g/dL (ref 30.0–36.0)
MCV: 86.8 fl (ref 78.0–100.0)
Monocytes Absolute: 0.6 10*3/uL (ref 0.1–1.0)
Monocytes Relative: 8.4 % (ref 3.0–12.0)
Neutro Abs: 4.3 10*3/uL (ref 1.4–7.7)
Neutrophils Relative %: 60.7 % (ref 43.0–77.0)
Platelets: 245 10*3/uL (ref 150.0–400.0)
RBC: 4.57 Mil/uL (ref 3.87–5.11)
RDW: 14.1 % (ref 11.5–15.5)
WBC: 7.1 10*3/uL (ref 4.0–10.5)

## 2021-08-20 LAB — COMPREHENSIVE METABOLIC PANEL
ALT: 21 U/L (ref 0–35)
AST: 26 U/L (ref 0–37)
Albumin: 4.3 g/dL (ref 3.5–5.2)
Alkaline Phosphatase: 47 U/L (ref 39–117)
BUN: 19 mg/dL (ref 6–23)
CO2: 25 mEq/L (ref 19–32)
Calcium: 10.1 mg/dL (ref 8.4–10.5)
Chloride: 102 mEq/L (ref 96–112)
Creatinine, Ser: 0.96 mg/dL (ref 0.40–1.20)
GFR: 59.56 mL/min — ABNORMAL LOW (ref >60.00)
Glucose, Bld: 91 mg/dL (ref 70–99)
Potassium: 4.1 mEq/L (ref 3.5–5.1)
Sodium: 136 mEq/L (ref 135–145)
Total Bilirubin: 0.6 mg/dL (ref 0.2–1.2)
Total Protein: 7.6 g/dL (ref 6.0–8.3)

## 2021-08-20 LAB — LIPID PANEL
Cholesterol: 180 mg/dL (ref 0–200)
HDL: 56.1 mg/dL (ref >39.00)
NonHDL: 123.68
Total CHOL/HDL Ratio: 3
Triglycerides: 264 mg/dL — ABNORMAL HIGH (ref 0.0–149.0)
VLDL: 52.8 mg/dL — ABNORMAL HIGH (ref 0.0–40.0)

## 2021-08-20 LAB — TSH: TSH: 0.09 u[IU]/mL — ABNORMAL LOW (ref 0.35–5.50)

## 2021-08-20 LAB — LDL CHOLESTEROL, DIRECT: Direct LDL: 101 mg/dL

## 2021-08-20 NOTE — Progress Notes (Signed)
Please add on Free t4 and total T3, dx: abnormal TSH Thanks, Dr. Mardelle Matte '

## 2021-08-23 ENCOUNTER — Telehealth: Payer: Self-pay

## 2021-08-23 NOTE — Addendum Note (Signed)
Addended by: Lorn Junes on: 08/23/2021 12:13 PM   Modules accepted: Orders

## 2021-08-23 NOTE — Addendum Note (Signed)
Addended by: Lorn Junes on: 08/23/2021 12:12 PM   Modules accepted: Orders

## 2021-08-23 NOTE — Telephone Encounter (Signed)
Free T4 unable to be added on. Lab will need to be ordered and lab appt scheduled to have additional tests.

## 2021-08-23 NOTE — Telephone Encounter (Signed)
Unable to reach patient will try again. Attempted 3x voicemail full

## 2021-08-27 NOTE — Telephone Encounter (Signed)
error 

## 2021-08-29 NOTE — Telephone Encounter (Signed)
Patient is scheduled to come get t4 done

## 2021-08-30 ENCOUNTER — Other Ambulatory Visit (INDEPENDENT_AMBULATORY_CARE_PROVIDER_SITE_OTHER): Payer: Medicare PPO

## 2021-08-30 ENCOUNTER — Other Ambulatory Visit: Payer: Self-pay

## 2021-08-30 DIAGNOSIS — R7989 Other specified abnormal findings of blood chemistry: Secondary | ICD-10-CM | POA: Diagnosis not present

## 2021-08-30 LAB — T4, FREE: Free T4: 1.14 ng/dL (ref 0.60–1.60)

## 2021-08-31 LAB — T3: T3, Total: 126 ng/dL (ref 76–181)

## 2021-09-04 NOTE — Progress Notes (Signed)
Is it still possible to add on a repeat TSH? Dx: abnormal TSH. Thanks, Dr. Mardelle Matte '

## 2021-09-11 ENCOUNTER — Other Ambulatory Visit: Payer: Self-pay

## 2021-09-11 ENCOUNTER — Encounter (INDEPENDENT_AMBULATORY_CARE_PROVIDER_SITE_OTHER): Payer: Medicare PPO | Admitting: Ophthalmology

## 2021-09-11 ENCOUNTER — Ambulatory Visit (INDEPENDENT_AMBULATORY_CARE_PROVIDER_SITE_OTHER): Payer: Medicare PPO | Admitting: Ophthalmology

## 2021-09-11 ENCOUNTER — Encounter (INDEPENDENT_AMBULATORY_CARE_PROVIDER_SITE_OTHER): Payer: Self-pay | Admitting: Ophthalmology

## 2021-09-11 DIAGNOSIS — H35371 Puckering of macula, right eye: Secondary | ICD-10-CM

## 2021-09-11 DIAGNOSIS — H4321 Crystalline deposits in vitreous body, right eye: Secondary | ICD-10-CM | POA: Insufficient documentation

## 2021-09-11 DIAGNOSIS — Z961 Presence of intraocular lens: Secondary | ICD-10-CM

## 2021-09-11 NOTE — Assessment & Plan Note (Signed)
The nature of macular pucker (epiretinal membrane ERM) was discussed with the patient as well as threshold criteria for vitrectomy surgery. I explained that in rare cases another surgery is needed to actually remove a second wrinkle should it regrow.  Most often, the epiretinal membrane and underlying wrinkled internal limiting membrane are removed with the first surgery, to accomplish the goals.   If the operative eye is [place] (natural lens still present), cataract surgery is often recommended prior to Vitrectomy. This will enable the retina surgeon to have the best view during surgery and the patient to obtain optimal results in the future. Treatment options were discussed.  I have recommended at home monitoring the near vision task in a monocular (1 eye at a time), with or without near vision glasses, to look for changes or declines in reading.  OD with mild to moderate thickening new over the last 3 years will continue to observe OD has no impact on acuity at this time

## 2021-09-11 NOTE — Progress Notes (Signed)
09/11/2021     CHIEF COMPLAINT Patient presents for  Chief Complaint  Patient presents with   Retina Evaluation      HISTORY OF PRESENT ILLNESS: Kelli Mcintyre is a 70 y.o. female who presents to the clinic today for:   HPI     Retina Evaluation   In both eyes.  This started 3 years ago.  Duration of 3 years.  Associated Symptoms Negative for Flashes, Floaters, Distortion, Blind Spot and Photophobia.  Context:  distance vision.  Treatments tried include no treatments.        Comments   3 yr FU OCT OU (missed appt due to Covid). Last seen 10/11/2018. Pt says right eye is not very clear, that her right eye cataract surgery was long ago compared to her left eye. Pt states she uses reading glasses for near. Pt states one year ago she was told she was borderline diabetic, but she is better now and states "A1C 6.0 or something."      Last edited by Nelva Nay on 09/11/2021  9:00 AM.      Referring physician: Willow Ora, MD [address] [place],  [place] [address]  HISTORICAL INFORMATION:   Selected notes from the MEDICAL RECORD NUMBER    Lab Results  Component Value Date   HGBA1C 6.0 (A) 08/19/2021     CURRENT MEDICATIONS: No current outpatient medications on file. (Ophthalmic Drugs)   No current facility-administered medications for this visit. (Ophthalmic Drugs)   Current Outpatient Medications (Other)  Medication Sig   amLODipine (NORVASC) 5 MG tablet Take 1 tablet (5 mg total) by mouth daily.   Ascorbic Acid (VITAMIN C) 1000 MG tablet Take 1,000 mg by mouth daily.   Cholecalciferol (VITAMIN D3) 50 MCG (2000 UT) TABS Take 50 mcg by mouth daily.   Cyanocobalamin (VITAMIN B12) 3000 MCG SUBL Take 3,000 mcg by mouth daily.   ezetimibe (ZETIA) 10 MG tablet TAKE 1 TABLET BY MOUTH EVERYDAY AT BEDTIME   losartan (COZAAR) 100 MG tablet Take 1 tablet (100 mg total) by mouth daily.   Magnesium 250 MG TABS Take 250 mg by mouth daily.   MegaRed Omega-3  Krill Oil 500 MG CAPS Take 500 mg by mouth daily.   Multiple Vitamin (MULTIVITAMIN) tablet Take 1 tablet by mouth daily.   Omega-3 Fatty Acids (FISH OIL) 1200 MG CAPS Take 1,200 mg by mouth daily.   Zinc 30 MG TABS Take 30 mg by mouth daily.   No current facility-administered medications for this visit. (Other)      REVIEW OF SYSTEMS:    ALLERGIES Allergies  Allergen Reactions   Lipitor [Atorvastatin Calcium] Other (See Comments)    myalgia    PAST MEDICAL HISTORY Past Medical History:  Diagnosis Date   Hyperlipemia    Dr. Pearson Grippe    Hypertension    Lumbar spinal stenosis 01/27/2020   Overactive bladder 08/19/2021   Mild; monitoring 08/2021   Urinary incontinence    Past Surgical History:  Procedure Laterality Date   CATARACT EXTRACTION N/A    TUBAL LIGATION      FAMILY HISTORY Family History  Problem Relation Age of Onset   Diabetes Mother    Hypertension Mother     SOCIAL HISTORY Social History   Tobacco Use   Smoking status: Never   Smokeless tobacco: Never  Vaping Use   Vaping Use: Never used  Substance Use Topics   Alcohol use: No   Drug use: No  OPHTHALMIC EXAM:  Base Eye Exam     Visual Acuity (ETDRS)       Right Left   Dist  20/25 -1 20/20         Tonometry (Tonopen, 9:05 AM)       Right Left   Pressure 10 11         Pupils       Pupils Dark Light Shape React APD   Right PERRL 4 3 Round Brisk None   Left PERRL 4 3 Round Brisk None         Visual Fields (Counting fingers)       Left Right    Full Full         Extraocular Movement       Right Left    Full Full         Neuro/Psych     Oriented x3: Yes   Mood/Affect: Normal         Dilation     Both eyes: 1.0% Mydriacyl, 2.5% Phenylephrine @ 9:05 AM           Slit Lamp and Fundus Exam     External Exam       Right Left   External Normal Normal         Slit Lamp Exam       Right Left   Lids/Lashes Normal Normal    Conjunctiva/Sclera White and quiet White and quiet   Cornea Clear Clear   Anterior Chamber Deep and quiet Deep and quiet   Iris Round and reactive Round and reactive   Lens Posterior chamber intraocular lens Posterior chamber intraocular lens   Anterior Vitreous Normal Normal         Fundus Exam       Right Left   Posterior Vitreous Asteroid hyalosis, trace inferior not in visual axis Normal   Disc Normal Normal   C/D Ratio 0.65 0.55   Macula Epiretinal membrane, mild topo distortion Normal   Vessels Normal Normal   Periphery Normal Normal            IMAGING AND PROCEDURES  Imaging and Procedures for 09/11/21  OCT, Retina - OU - Both Eyes       Right Eye Quality was good. Scan locations included subfoveal. Central Foveal Thickness: 332. Progression has worsened. Findings include abnormal foveal contour, epiretinal membrane.   Left Eye Quality was good. Scan locations included subfoveal. Progression has been stable. Findings include normal foveal contour.   Notes Over the last 3 years, new epiretinal membrane with moderate topographic distortion and elevation yet with good acuity will observe             ASSESSMENT/PLAN:  Epiretinal membrane, right eye The nature of macular pucker (epiretinal membrane ERM) was discussed with the patient as well as threshold criteria for vitrectomy surgery. I explained that in rare cases another surgery is needed to actually remove a second wrinkle should it regrow.  Most often, the epiretinal membrane and underlying wrinkled internal limiting membrane are removed with the first surgery, to accomplish the goals.   If the operative eye is Phakic (natural lens still present), cataract surgery is often recommended prior to Vitrectomy. This will enable the retina surgeon to have the best view during surgery and the patient to obtain optimal results in the future. Treatment options were discussed.  I have recommended at home monitoring  the near vision task in a monocular (1 eye at a time), with  or without near vision glasses, to look for changes or declines in reading.  OD with mild to moderate thickening new over the last 3 years will continue to observe OD has no impact on acuity at this time  Asteroid hyalosis of right eye Minor inferior vitreous cavity no impact on acuity     ICD-10-CM   1. Epiretinal membrane, right eye  H35.371 OCT, Retina - OU - Both Eyes    2. Asteroid hyalosis of right eye  H43.21     3. Pseudophakia of both eyes  Z96.1       1.  Follow-up at 3 years for mild asteroid hyalosis of the right eye with no significant progression  2.  Incidental finding with no impact on acuity OD today however his epiretinal membrane with significant macular thickening.  3.  Dilate OU next and follow-up in 1 year or promptly if new visual symptoms develop.  Ophthalmic Meds Ordered this visit:  No orders of the defined types were placed in this encounter.      Return in about 1 year (around 09/11/2022) for DILATE OU, OCT.  There are no Patient Instructions on file for this visit.   Explained the diagnoses, plan, and follow up with the patient and they expressed understanding.  Patient expressed understanding of the importance of proper follow up care.   Alford Highland Kotaro Buer M.D. Diseases & Surgery of the Retina and Vitreous [facility] 09/11/21     Abbreviations: M myopia (nearsighted); A astigmatism; H hyperopia (farsighted); P presbyopia; Mrx spectacle prescription;  CTL contact lenses; OD right eye; OS left eye; OU both eyes  XT exotropia; ET esotropia; PEK punctate epithelial keratitis; PEE punctate epithelial erosions; DES dry eye syndrome; MGD meibomian gland dysfunction; ATs artificial tears; PFAT's preservative free artificial tears; NSC nuclear sclerotic cataract; PSC posterior subcapsular cataract; ERM epi-retinal membrane; PVD posterior vitreous detachment; RD retinal  detachment; DM diabetes mellitus; DR diabetic retinopathy; NPDR non-proliferative diabetic retinopathy; PDR proliferative diabetic retinopathy; CSME clinically significant macular edema; DME diabetic macular edema; dbh dot blot hemorrhages; CWS cotton wool spot; POAG primary open angle glaucoma; C/D cup-to-disc ratio; HVF humphrey visual field; GVF goldmann visual field; OCT optical coherence tomography; IOP intraocular pressure; BRVO Branch retinal vein occlusion; CRVO central retinal vein occlusion; CRAO central retinal artery occlusion; BRAO branch retinal artery occlusion; RT retinal tear; SB scleral buckle; PPV pars plana vitrectomy; VH Vitreous hemorrhage; PRP panretinal laser photocoagulation; IVK intravitreal kenalog; VMT vitreomacular traction; MH Macular hole;  NVD neovascularization of the disc; NVE neovascularization elsewhere; AREDS age related eye disease study; ARMD age related macular degeneration; POAG primary open angle glaucoma; EBMD epithelial/anterior basement membrane dystrophy; ACIOL anterior chamber intraocular lens; IOL intraocular lens; PCIOL posterior chamber intraocular lens; Phaco/IOL phacoemulsification with intraocular lens placement; PRK photorefractive keratectomy; LASIK laser assisted in situ keratomileusis; HTN hypertension; DM diabetes mellitus; COPD chronic obstructive pulmonary disease

## 2021-09-11 NOTE — Assessment & Plan Note (Signed)
Minor inferior vitreous cavity no impact on acuity

## 2021-09-12 ENCOUNTER — Encounter: Payer: Self-pay | Admitting: Family Medicine

## 2022-02-12 DIAGNOSIS — Z833 Family history of diabetes mellitus: Secondary | ICD-10-CM | POA: Diagnosis not present

## 2022-02-12 DIAGNOSIS — I10 Essential (primary) hypertension: Secondary | ICD-10-CM | POA: Diagnosis not present

## 2022-02-12 DIAGNOSIS — Z8249 Family history of ischemic heart disease and other diseases of the circulatory system: Secondary | ICD-10-CM | POA: Diagnosis not present

## 2022-02-12 DIAGNOSIS — Z7982 Long term (current) use of aspirin: Secondary | ICD-10-CM | POA: Diagnosis not present

## 2022-02-15 ENCOUNTER — Other Ambulatory Visit: Payer: Self-pay | Admitting: Family Medicine

## 2022-02-15 DIAGNOSIS — I10 Essential (primary) hypertension: Secondary | ICD-10-CM

## 2022-02-17 ENCOUNTER — Encounter: Payer: Self-pay | Admitting: Family Medicine

## 2022-02-17 ENCOUNTER — Ambulatory Visit: Payer: Medicare PPO | Admitting: Family Medicine

## 2022-02-17 VITALS — BP 128/74 | HR 71 | Temp 98.2°F | Ht 60.0 in | Wt 146.4 lb

## 2022-02-17 DIAGNOSIS — I10 Essential (primary) hypertension: Secondary | ICD-10-CM

## 2022-02-17 DIAGNOSIS — R7989 Other specified abnormal findings of blood chemistry: Secondary | ICD-10-CM | POA: Diagnosis not present

## 2022-02-17 DIAGNOSIS — R7303 Prediabetes: Secondary | ICD-10-CM

## 2022-02-17 DIAGNOSIS — E782 Mixed hyperlipidemia: Secondary | ICD-10-CM

## 2022-02-17 LAB — TSH: TSH: 3.33 u[IU]/mL (ref 0.35–5.50)

## 2022-02-17 LAB — T4, FREE: Free T4: 0.8 ng/dL (ref 0.60–1.60)

## 2022-02-17 LAB — HEMOGLOBIN A1C: Hgb A1c MFr Bld: 6.7 % — ABNORMAL HIGH (ref 4.6–6.5)

## 2022-02-17 MED ORDER — LOSARTAN POTASSIUM 100 MG PO TABS: 100.0000 mg | ORAL_TABLET | Freq: Every day | ORAL | 3 refills | Status: DC

## 2022-02-17 MED ORDER — AMLODIPINE BESYLATE 5 MG PO TABS: 5.0000 mg | ORAL_TABLET | Freq: Every day | ORAL | 3 refills | Status: DC

## 2022-02-17 NOTE — Patient Instructions (Addendum)
Please return in 6 months for your annual complete physical; please come fasting.  ? ?Keep checking your blood pressures at home and bring in the log to your next appointment. We want it < 140/90 always.  ? ?Your mammogram is due in May, please schedule.  ? ?I have ordered a mammogram and/or bone density for you as we discussed today: ?[x]   Mammogram  ?[]   Bone Density ? ?Please call the office checked below to schedule your appointment: ? ?[x]   [facility]     ? [place], [place]       ? [phone]        ? ?[]   [facility] ? [place]  ? [place], [place] ? [phone] ? ? ?If you have any questions or concerns, please don't hesitate to send me a message via MyChart or call the office at [phone]. Thank you for visiting with [place] today! It's our pleasure caring for you.  ?

## 2022-02-17 NOTE — Progress Notes (Signed)
? ?Subjective  ?CC:  ?Chief Complaint  ?Patient presents with  ? Hypertension  ?  Pt has no concerns today.   ? Pre Diabetes  ? ? ?HPI: Kelli Mcintyre is a 71 y.o. female who presents to the office today to address the problems listed above in the chief complaint. ?Hypertension f/u: Control is good . Pt reports she is doing well. taking medications as instructed, no medication side effects noted, no TIAs, no chest pain on exertion, no dyspnea on exertion, no swelling of ankles. Home readings taken daily are normal: avg 120s/70s, sometimes in the 80s. Today is high in office ... nerves she thinks. She denies adverse effects from his BP medications. Compliance with medication is good.  ?Prediabetes: eating well. No sxs of hyperglycemia but she worries about getting diabetes.  ?Low tsh with nl t4 and t3 6 months ago. No sxs of high or low thyroid.  ?HM: mammo due in may ? ?Assessment  ?1. Mixed hyperlipidemia   ?2. Essential hypertension   ?3. Prediabetes   ?4. Abnormal TSH   ? ?  ?Plan  ? ?Hypertension f/u: BP control is well controlled. Continue home monitoring. Refilled losartan 100 qd and amlodipine 10 qd ?Hyperlipidemia f/u: on zetia and tolerating it.  ?Recheck a1c.  ?Recheck thyroid studies.  ? ?Education regarding management of these chronic disease states was given. Management strategies discussed on successive visits include dietary and exercise recommendations, goals of achieving and maintaining IBW, and lifestyle modifications aiming for adequate sleep and minimizing stressors.  ? ?Follow up: Return in about 6 months (around 08/19/2022) for complete physical. ? ?Orders Placed This Encounter  ?Procedures  ? Hemoglobin A1c  ? T4, free  ? T3  ? TSH  ? ?Meds ordered this encounter  ?Medications  ? amLODipine (NORVASC) 5 MG tablet  ?  Sig: Take 1 tablet (5 mg total) by mouth daily.  ?  Dispense:  90 tablet  ?  Refill:  3  ? losartan (COZAAR) 100 MG tablet  ?  Sig: Take 1 tablet (100 mg total) by mouth daily.  ?   Dispense:  90 tablet  ?  Refill:  3  ? ?  ? ?BP Readings from Last 3 Encounters:  ?02/17/22 128/74  ?08/19/21 120/80  ?01/28/21 138/80  ? ?Wt Readings from Last 3 Encounters:  ?02/17/22 146 lb 6.4 oz (66.4 kg)  ?08/19/21 144 lb (65.3 kg)  ?01/28/21 145 lb 9.6 oz (66 kg)  ? ? ?Lab Results  ?Component Value Date  ? CHOL 180 08/19/2021  ? CHOL 181 01/28/2021  ? CHOL 203 (H) 04/25/2020  ? ?Lab Results  ?Component Value Date  ? HDL 56.10 08/19/2021  ? HDL 55.60 01/28/2021  ? HDL 55.20 04/25/2020  ? ?Lab Results  ?Component Value Date  ? LDLCALC 114 (H) 04/25/2020  ? LDLCALC 130 (H) 06/28/2019  ? LDLCALC 125 (H) 09/29/2018  ? ?Lab Results  ?Component Value Date  ? TRIG 264.0 (H) 08/19/2021  ? TRIG 270.0 (H) 01/28/2021  ? TRIG 172.0 (H) 04/25/2020  ? ?Lab Results  ?Component Value Date  ? CHOLHDL 3 08/19/2021  ? CHOLHDL 3 01/28/2021  ? CHOLHDL 4 04/25/2020  ? ?Lab Results  ?Component Value Date  ? LDLDIRECT 101.0 08/19/2021  ? LDLDIRECT 96.0 01/28/2021  ? ?Lab Results  ?Component Value Date  ? CREATININE 0.96 08/19/2021  ? BUN 19 08/19/2021  ? NA 136 08/19/2021  ? K 4.1 08/19/2021  ? CL 102 08/19/2021  ? CO2 25  08/19/2021  ? ? ?The 10-year ASCVD risk score (Arnett DK, et al., 2019) is: 13.6% ?  Values used to calculate the score: ?    Age: 51 years ?    Sex: Female ?    Is Non-Hispanic African American: No ?    Diabetic: No ?    Tobacco smoker: No ?    Systolic Blood Pressure: 128 mmHg ?    Is BP treated: Yes ?    HDL Cholesterol: 56.1 mg/dL ?    Total Cholesterol: 180 mg/dL ? ?I reviewed the patients updated PMH, FH, and SocHx.  ?  ?Patient Active Problem List  ? Diagnosis Date Noted  ? Mixed hyperlipidemia 12/07/2018  ?  Priority: High  ? Prediabetes 03/23/2018  ?  Priority: High  ? Essential hypertension 06/15/2017  ?  Priority: High  ? Lumbar spinal stenosis 01/27/2020  ?  Priority: Medium   ? Osteopenia 03/23/2018  ?  Priority: Medium   ? DJD (degenerative joint disease), lumbar 06/15/2017  ?  Priority: Medium   ?  Myalgia due to statin 06/28/2019  ?  Priority: Low  ? Asteroid hyalosis of right eye 09/11/2021  ? Pseudophakia of both eyes 09/11/2021  ? Epiretinal membrane, right eye 09/11/2021  ? Overactive bladder 08/19/2021  ? ? ?Allergies: Lipitor [atorvastatin calcium] ? ?Social History: ?Patient  reports that she has never smoked. She has never used smokeless tobacco. She reports that she does not drink alcohol and does not use drugs. ? ?Current Meds  ?Medication Sig  ? Ascorbic Acid (VITAMIN C) 1000 MG tablet Take 1,000 mg by mouth daily.  ? Cholecalciferol (VITAMIN D3) 50 MCG (2000 UT) TABS Take 50 mcg by mouth daily.  ? Cyanocobalamin (VITAMIN B12) 3000 MCG SUBL Take 3,000 mcg by mouth daily.  ? Magnesium 250 MG TABS Take 250 mg by mouth daily.  ? MegaRed Omega-3 Krill Oil 500 MG CAPS Take 500 mg by mouth daily.  ? Multiple Vitamin (MULTIVITAMIN) tablet Take 1 tablet by mouth daily.  ? Omega-3 Fatty Acids (FISH OIL) 1200 MG CAPS Take 1,200 mg by mouth daily.  ? Zinc 30 MG TABS Take 30 mg by mouth daily.  ? [DISCONTINUED] amLODipine (NORVASC) 5 MG tablet Take 1 tablet (5 mg total) by mouth daily.  ? [DISCONTINUED] losartan (COZAAR) 100 MG tablet Take 1 tablet (100 mg total) by mouth daily.  ? ? ?Review of Systems: ?Cardiovascular: negative for chest pain, palpitations, leg swelling, orthopnea ?Respiratory: negative for SOB, wheezing or persistent cough ?Gastrointestinal: negative for abdominal pain ?Genitourinary: negative for dysuria or gross hematuria ? ?Objective  ?Vitals: BP 128/74 Comment: pt reports home bps  Pulse 71   Temp 98.2 ?F (36.8 ?C) (Temporal)   Ht 5' (1.524 m)   Wt 146 lb 6.4 oz (66.4 kg)   SpO2 97%   BMI 28.59 kg/m?  ?General: no acute distress  ?Psych:  Alert and oriented, normal mood and affect ?HEENT:  Normocephalic, atraumatic, supple neck  ?Cardiovascular:  RRR without murmur. no edema ?Respiratory:  Good breath sounds bilaterally, CTAB with normal respiratory effort ?Skin:  Warm, no  rashes ?Neurologic:   Mental status is normal ?Commons side effects, risks, benefits, and alternatives for medications and treatment plan prescribed today were discussed, and the patient expressed understanding of the given instructions. Patient is instructed to call or message via MyChart if he/she has any questions or concerns regarding our treatment plan. No barriers to understanding were identified. We discussed Red Flag symptoms and signs  in detail. Patient expressed understanding regarding what to do in case of urgent or emergency type symptoms.  ?Medication list was reconciled, printed and provided to the patient in AVS. Patient instructions and summary information was reviewed with the patient as documented in the AVS. ?This note was prepared with assistance of Systems analyst. Occasional wrong-word or sound-a-like substitutions may have occurred due to the inherent limitations of voice recognition software ? ?This visit occurred during the SARS-CoV-2 public health emergency.  Safety protocols were in place, including screening questions prior to the visit, additional usage of staff PPE, and extensive cleaning of exam room while observing appropriate contact time as indicated for disinfecting solutions.  ?

## 2022-02-18 LAB — T3: T3, Total: 103 ng/dL (ref 76–181)

## 2022-04-16 ENCOUNTER — Other Ambulatory Visit: Payer: Self-pay | Admitting: Family Medicine

## 2022-04-16 DIAGNOSIS — Z1231 Encounter for screening mammogram for malignant neoplasm of breast: Secondary | ICD-10-CM

## 2022-04-28 ENCOUNTER — Ambulatory Visit
Admission: RE | Admit: 2022-04-28 | Discharge: 2022-04-28 | Disposition: A | Discharge status: Home / Self Care | Payer: Medicare PPO | Source: Ambulatory Visit | Attending: Family Medicine | Admitting: Family Medicine

## 2022-04-28 DIAGNOSIS — Z1231 Encounter for screening mammogram for malignant neoplasm of breast: Secondary | ICD-10-CM

## 2022-06-30 ENCOUNTER — Other Ambulatory Visit: Payer: Self-pay | Admitting: Family Medicine

## 2022-07-01 DIAGNOSIS — H524 Presbyopia: Secondary | ICD-10-CM | POA: Diagnosis not present

## 2022-07-01 DIAGNOSIS — H35033 Hypertensive retinopathy, bilateral: Secondary | ICD-10-CM | POA: Diagnosis not present

## 2022-08-11 ENCOUNTER — Encounter: Payer: Self-pay | Admitting: *Deleted

## 2022-09-11 ENCOUNTER — Encounter (INDEPENDENT_AMBULATORY_CARE_PROVIDER_SITE_OTHER): Payer: Self-pay

## 2022-09-11 ENCOUNTER — Encounter (INDEPENDENT_AMBULATORY_CARE_PROVIDER_SITE_OTHER): Payer: Medicare PPO | Admitting: Ophthalmology

## 2022-09-16 ENCOUNTER — Ambulatory Visit (INDEPENDENT_AMBULATORY_CARE_PROVIDER_SITE_OTHER): Payer: Medicare PPO | Admitting: Family Medicine

## 2022-09-16 ENCOUNTER — Encounter: Payer: Self-pay | Admitting: Family Medicine

## 2022-09-16 VITALS — BP 128/82 | HR 75 | Temp 98.2°F | Ht 60.0 in | Wt 139.4 lb

## 2022-09-16 DIAGNOSIS — M19042 Primary osteoarthritis, left hand: Secondary | ICD-10-CM | POA: Insufficient documentation

## 2022-09-16 DIAGNOSIS — G720 Drug-induced myopathy: Secondary | ICD-10-CM | POA: Diagnosis not present

## 2022-09-16 DIAGNOSIS — Z0000 Encounter for general adult medical examination without abnormal findings: Secondary | ICD-10-CM | POA: Diagnosis not present

## 2022-09-16 DIAGNOSIS — M19041 Primary osteoarthritis, right hand: Secondary | ICD-10-CM | POA: Diagnosis not present

## 2022-09-16 DIAGNOSIS — N3281 Overactive bladder: Secondary | ICD-10-CM

## 2022-09-16 DIAGNOSIS — R7303 Prediabetes: Secondary | ICD-10-CM | POA: Diagnosis not present

## 2022-09-16 DIAGNOSIS — I10 Essential (primary) hypertension: Secondary | ICD-10-CM | POA: Diagnosis not present

## 2022-09-16 DIAGNOSIS — E782 Mixed hyperlipidemia: Secondary | ICD-10-CM | POA: Diagnosis not present

## 2022-09-16 DIAGNOSIS — M8580 Other specified disorders of bone density and structure, unspecified site: Secondary | ICD-10-CM

## 2022-09-16 LAB — POCT GLYCOSYLATED HEMOGLOBIN (HGB A1C): Hemoglobin A1C: 5.8 % — AB (ref 4.0–5.6)

## 2022-09-16 MED ORDER — DICLOFENAC SODIUM 1 % EX GEL: 2.0000 g | Freq: Four times a day (QID) | CUTANEOUS | 5 refills | Status: DC | PRN

## 2022-09-16 NOTE — Patient Instructions (Signed)
Please return in 6 months to recheck blood pressure and sugars  I will release your lab results to you on your MyChart account with further instructions. You may see the results before I do, but when I review them I will send you a message with my report or have my assistant call you if things need to be discussed. Please reply to my message with any questions. Thank you!   If you have any questions or concerns, please don't hesitate to send me a message via MyChart or call the office at [phone]. Thank you for visiting with [place] today! It's our pleasure caring for you.   You may try Citracal: it is a calcium supplement that you may tolerate better.

## 2022-09-16 NOTE — Progress Notes (Signed)
Subjective  Chief Complaint  Patient presents with   Annual Exam    Pt here for Annual exam and is currently fasting     HPI: Kelli Mcintyre is a 71 y.o. female who presents to [facility] today for a Female Wellness Visit. She also has the concerns and/or needs as listed above in the chief complaint. These will be addressed in addition to the Health Maintenance Visit.   Wellness Visit: annual visit with health maintenance review and exam without Pap  HM: screens and imms are up to date. Eye exam: scheduled for next month. Eating well: more veggies/less carbs. Feels well Chronic disease f/u and/or acute problem visit: (deemed necessary to be done in addition to the wellness visit): HTN: well controlled on amlodipine 10and losartan 100 daily. Feeling well. Taking medications w/o adverse effects. No symptoms of CHF, angina; no palpitations, sob, cp or lower extremity edema. Compliant with meds.  HLD: on zetia and fish oil. Intolerant to statins. Tolerating well.  PreDiabetes: eating better w/o sxs of hyperglycemia.  Osteopenia: on vit d; had constipation with calcium. No fractures.  OAB with mild nonirritative sxs Intermittent distal joint hand pain: cooks and gardens. No other swollen or painful joints now.   Assessment  1. Annual physical exam   2. Mixed hyperlipidemia   3. Essential hypertension   4. Drug-induced myopathy   5. Osteopenia, unspecified location   6. Prediabetes   7. Overactive bladder   8. Primary osteoarthritis of both hands      Plan  Female Wellness Visit: Age appropriate Health Maintenance and Prevention measures were discussed with patient. Included topics are cancer screening recommendations, ways to keep healthy (see AVS) including dietary and exercise recommendations, regular eye and dental care, use of seat belts, and avoidance of moderate alcohol use and tobacco use.  BMI: discussed patient's BMI and encouraged positive lifestyle  modifications to help get to or maintain a target BMI. HM needs and immunizations were addressed and ordered. See below for orders. See HM and immunization section for updates. Routine labs and screening tests ordered including cmp, cbc and lipids where appropriate. Discussed recommendations regarding Vit D and calcium supplementation (see AVS)  Chronic disease management visit and/or acute problem visit: HTN remains controlled on amlodipin 10 andlosartan 100. Check renal function and electrolytes. HLD: fasting for recheck.will need to return for lab work due to lab tech being out today. Check lfts Discussed mgt of OAB sxs. No incontinence. Will monitor Prediabetes: discussed diet recommnedatins. Voltaren gel for prn hand pain  Follow up: 39mo for htn and prediabetes recheck  Orders Placed This Encounter  Procedures   CBC with Differential/Platelet   Comprehensive metabolic panel   Lipid panel   TSH   POCT HgB A1C   Meds ordered this encounter  Medications   diclofenac Sodium (VOLTAREN) 1 % GEL    Sig: Apply 2 g topically 4 (four) times daily as needed (hand pain).    Dispense:  150 g    Refill:  5      Body mass index is 27.22 kg/m. Wt Readings from Last 3 Encounters:  09/16/22 139 lb 6.4 oz (63.2 kg)  02/17/22 146 lb 6.4 oz (66.4 kg)  08/19/21 144 lb (65.3 kg)     Patient Active Problem List   Diagnosis Date Noted   Mixed hyperlipidemia 12/07/2018    Priority: High   Prediabetes 03/23/2018    Priority: High   Essential hypertension 06/15/2017  Priority: High   Lumbar spinal stenosis 01/27/2020    Priority: Medium    Osteopenia 03/23/2018    Priority: Medium     Dexa 05/2021: lowest T = -1.1; recheck 2-3 years    DJD (degenerative joint disease), lumbar 06/15/2017    Priority: Medium     MRI LUMBAR 06/29/17: 1. Lumbar facet arthropathy most advanced at L4-5 where there is anterolisthesis. Milder disc degeneration with generalized disc bulging. 2. L4-5 advanced  spinal stenosis. Moderate right foraminal impingement. 3. Noncompressive degenerative changes described above.      Myalgia due to statin 06/28/2019    Priority: Low   Primary osteoarthritis of both hands 09/16/2022   Asteroid hyalosis of right eye 09/11/2021   Pseudophakia of both eyes 09/11/2021   Epiretinal membrane, right eye 09/11/2021   Overactive bladder 08/19/2021    Mild; monitoring 08/2021    Health Maintenance  Topic Date Due   Medicare Annual Wellness (AWV)  06/27/2020   COVID-19 Vaccine (5 - Moderna series) 01/06/2023   MAMMOGRAM  04/29/2023   DEXA SCAN  05/24/2024   COLONOSCOPY (Pts 45-60yrs Insurance coverage will need to be confirmed)  05/18/2027   TETANUS/TDAP  12/27/2027   Pneumonia Vaccine 24+ Years old  Completed   INFLUENZA VACCINE  Completed   Zoster Vaccines- Shingrix  Completed   HPV VACCINES  Aged Out   Hepatitis C Screening  Discontinued   Immunization History  Administered Date(s) Administered   Fluad Quad(high Dose 65+) 08/19/2021, 09/13/2022   Hepatitis A, Adult 11/23/2013, 05/23/2014   Influenza Split 08/25/2015, 08/30/2016   Influenza, High Dose Seasonal PF 08/20/2018, 08/16/2019   Influenza,inj,Quad PF,6+ Mos 07/29/2017, 07/22/2020   Influenza-Unspecified 09/15/2012, 08/11/2013, 08/21/2014   Moderna Covid-19 Vaccine Bivalent Booster 50yrs & up 09/05/2022   Moderna Sars-Covid-2 Vaccination 12/29/2019, 01/31/2020, 03/15/2021   Pneumococcal Conjugate-13 10/30/2014, 12/26/2017   Pneumococcal Polysaccharide-23 12/26/2018   Tdap 12/26/2017   Zoster Recombinat (Shingrix) 02/10/2018, 06/08/2018   Zoster, Live 07/26/2013   We updated and reviewed the patient's past history in detail and it is documented below. Allergies: Patient is allergic to lipitor [atorvastatin calcium]. Past Medical History Patient  has a past medical history of Hyperlipemia, Hypertension, Lumbar spinal stenosis (01/27/2020), Overactive bladder (08/19/2021), and Urinary  incontinence. Past Surgical History Patient  has a past surgical history that includes Cataract extraction (N/A) and Tubal ligation. Family History: Patient family history includes Diabetes in her mother; Hypertension in her mother. Social History:  Patient  reports that she has never smoked. She has never used smokeless tobacco. She reports that she does not drink alcohol and does not use drugs.  Review of Systems: Constitutional: negative for fever or malaise Ophthalmic: negative for photophobia, double vision or loss of vision Cardiovascular: negative for chest pain, dyspnea on exertion, or new LE swelling. Rare light headedness. No palpitations Respiratory: negative for SOB or persistent cough Gastrointestinal: negative for abdominal pain, change in bowel habits or melena Genitourinary: negative for dysuria or gross hematuria, no abnormal uterine bleeding or disharge Musculoskeletal: negative for new gait disturbance or muscular weakness Integumentary: negative for new or persistent rashes, no breast lumps Neurological: negative for TIA or stroke symptoms Psychiatric: negative for SI or delusions Allergic/Immunologic: negative for hives  Patient Care Team    Relationship Specialty Notifications Start End  Willow Ora, MD PCP - General Family Medicine  01/27/20     Objective  Vitals: BP 128/82   Pulse 75   Temp 98.2 F (36.8 C)   Ht 5' (  1.524 m)   Wt 139 lb 6.4 oz (63.2 kg)   SpO2 96%   BMI 27.22 kg/m  General:  Well developed, well nourished, no acute distress  Psych:  Alert and orientedx3,normal mood and affect HEENT:  Normocephalic, atraumatic, non-icteric sclera,  supple neck without adenopathy, mass or thyromegaly Cardiovascular:  Normal S1, S2, RRR without gallop, rub 2/6 sytolic murmur Respiratory:  Good breath sounds bilaterally, CTAB with normal respiratory effort Gastrointestinal: normal bowel sounds, soft, non-tender, no noted masses. No HSM MSK: OA changes  in DIPs hands, no contusions. Joints are without erythema or swelling.  Skin:  Warm, no rashes or suspicious lesions noted Neurologic:    Mental status is normal. CN 2-11 are normal. Gross motor and sensory exams are normal. Normal gait. No tremor   Commons side effects, risks, benefits, and alternatives for medications and treatment plan prescribed today were discussed, and the patient expressed understanding of the given instructions. Patient is instructed to call or message via MyChart if he/she has any questions or concerns regarding our treatment plan. No barriers to understanding were identified. We discussed Red Flag symptoms and signs in detail. Patient expressed understanding regarding what to do in case of urgent or emergency type symptoms.  Medication list was reconciled, printed and provided to the patient in AVS. Patient instructions and summary information was reviewed with the patient as documented in the AVS. This note was prepared with assistance of Dragon voice recognition software. Occasional wrong-word or sound-a-like substitutions may have occurred due to the inherent limitations of voice recognition software

## 2022-09-18 ENCOUNTER — Other Ambulatory Visit (INDEPENDENT_AMBULATORY_CARE_PROVIDER_SITE_OTHER): Payer: Medicare PPO

## 2022-09-18 DIAGNOSIS — E782 Mixed hyperlipidemia: Secondary | ICD-10-CM | POA: Diagnosis not present

## 2022-09-18 DIAGNOSIS — I10 Essential (primary) hypertension: Secondary | ICD-10-CM

## 2022-09-18 DIAGNOSIS — Z0000 Encounter for general adult medical examination without abnormal findings: Secondary | ICD-10-CM | POA: Diagnosis not present

## 2022-09-18 LAB — TSH: TSH: 2.57 u[IU]/mL (ref 0.35–5.50)

## 2022-09-18 LAB — LIPID PANEL
Cholesterol: 173 mg/dL (ref 0–200)
HDL: 53.7 mg/dL (ref >39.00)
LDL Cholesterol: 92 mg/dL (ref 0–99)
NonHDL: 118.97
Total CHOL/HDL Ratio: 3
Triglycerides: 136 mg/dL (ref 0.0–149.0)
VLDL: 27.2 mg/dL (ref 0.0–40.0)

## 2022-09-18 LAB — CBC WITH DIFFERENTIAL/PLATELET
Basophils Absolute: 0.1 10*3/uL (ref 0.0–0.1)
Basophils Relative: 0.7 % (ref 0.0–3.0)
Eosinophils Absolute: 0.3 10*3/uL (ref 0.0–0.7)
Eosinophils Relative: 3.3 % (ref 0.0–5.0)
HCT: 40.9 % (ref 36.0–46.0)
Hemoglobin: 13.5 g/dL (ref 12.0–15.0)
Lymphocytes Relative: 29.9 % (ref 12.0–46.0)
Lymphs Abs: 2.4 10*3/uL (ref 0.7–4.0)
MCHC: 33 g/dL (ref 30.0–36.0)
MCV: 88.1 fl (ref 78.0–100.0)
Monocytes Absolute: 0.6 10*3/uL (ref 0.1–1.0)
Monocytes Relative: 7 % (ref 3.0–12.0)
Neutro Abs: 4.8 10*3/uL (ref 1.4–7.7)
Neutrophils Relative %: 59.1 % (ref 43.0–77.0)
Platelets: 219 10*3/uL (ref 150.0–400.0)
RBC: 4.64 Mil/uL (ref 3.87–5.11)
RDW: 14.5 % (ref 11.5–15.5)
WBC: 8.2 10*3/uL (ref 4.0–10.5)

## 2022-09-18 LAB — COMPREHENSIVE METABOLIC PANEL
ALT: 17 U/L (ref 0–35)
AST: 21 U/L (ref 0–37)
Albumin: 4.2 g/dL (ref 3.5–5.2)
Alkaline Phosphatase: 41 U/L (ref 39–117)
BUN: 20 mg/dL (ref 6–23)
CO2: 27 mEq/L (ref 19–32)
Calcium: 9.4 mg/dL (ref 8.4–10.5)
Chloride: 105 mEq/L (ref 96–112)
Creatinine, Ser: 0.99 mg/dL (ref 0.40–1.20)
GFR: 56.97 mL/min — ABNORMAL LOW (ref >60.00)
Glucose, Bld: 92 mg/dL (ref 70–99)
Potassium: 4.2 mEq/L (ref 3.5–5.1)
Sodium: 140 mEq/L (ref 135–145)
Total Bilirubin: 0.6 mg/dL (ref 0.2–1.2)
Total Protein: 7.5 g/dL (ref 6.0–8.3)

## 2022-11-05 DIAGNOSIS — H35371 Puckering of macula, right eye: Secondary | ICD-10-CM | POA: Diagnosis not present

## 2022-11-05 DIAGNOSIS — H4321 Crystalline deposits in vitreous body, right eye: Secondary | ICD-10-CM | POA: Diagnosis not present

## 2023-03-03 ENCOUNTER — Other Ambulatory Visit: Payer: Self-pay | Admitting: Family Medicine

## 2023-03-03 DIAGNOSIS — I10 Essential (primary) hypertension: Secondary | ICD-10-CM

## 2023-03-04 ENCOUNTER — Encounter (HOSPITAL_BASED_OUTPATIENT_CLINIC_OR_DEPARTMENT_OTHER): Payer: Self-pay | Admitting: Emergency Medicine

## 2023-03-04 ENCOUNTER — Other Ambulatory Visit: Payer: Self-pay

## 2023-03-04 ENCOUNTER — Emergency Department (HOSPITAL_BASED_OUTPATIENT_CLINIC_OR_DEPARTMENT_OTHER)
Admission: EM | Admit: 2023-03-04 | Discharge: 2023-03-04 | Disposition: A | Discharge status: Home / Self Care | Payer: Medicare PPO | Attending: Emergency Medicine | Admitting: Emergency Medicine

## 2023-03-04 DIAGNOSIS — J069 Acute upper respiratory infection, unspecified: Secondary | ICD-10-CM | POA: Diagnosis not present

## 2023-03-04 DIAGNOSIS — I10 Essential (primary) hypertension: Secondary | ICD-10-CM | POA: Insufficient documentation

## 2023-03-04 DIAGNOSIS — Z79899 Other long term (current) drug therapy: Secondary | ICD-10-CM | POA: Insufficient documentation

## 2023-03-04 DIAGNOSIS — R059 Cough, unspecified: Secondary | ICD-10-CM | POA: Diagnosis not present

## 2023-03-04 DIAGNOSIS — Z1152 Encounter for screening for COVID-19: Secondary | ICD-10-CM | POA: Diagnosis not present

## 2023-03-04 DIAGNOSIS — R509 Fever, unspecified: Secondary | ICD-10-CM | POA: Diagnosis not present

## 2023-03-04 DIAGNOSIS — Z7982 Long term (current) use of aspirin: Secondary | ICD-10-CM | POA: Insufficient documentation

## 2023-03-04 LAB — SARS CORONAVIRUS 2 BY RT PCR: SARS Coronavirus 2 by RT PCR: NEGATIVE

## 2023-03-04 NOTE — ED Provider Notes (Addendum)
[place] EMERGENCY DEPARTMENT AT [facility] Provider Note   CSN: [id number] Arrival date & time: 03/04/23  0037     History  Chief Complaint  Patient presents with   Fever    Kelli Mcintyre is a 72 y.o. female.  Patient is a 72 year old female presenting with her husband with complaints of cough, low-grade fever, and hypertension at home.  She took 2 Tylenol, then came here.  Since that time, her blood pressure and fever have improved.  She does describe a nonproductive cough, but no chest pain or shortness of breath.  She is here with her husband who is ill in a similar fashion.  The history is provided by the patient.       Home Medications Prior to Admission medications   Medication Sig Start Date End Date Taking? Authorizing Provider  amLODipine (NORVASC) 5 MG tablet Take 1 tablet (5 mg total) by mouth daily. 02/17/22   Willow Ora, MD  Ascorbic Acid (VITAMIN C) 1000 MG tablet Take 1,000 mg by mouth daily.    [provider]  aspirin 81 MG chewable tablet 1 tablet    [provider]  Cholecalciferol (VITAMIN D3) 50 MCG (2000 UT) TABS Take 50 mcg by mouth daily. 08/17/18   [provider]  Cyanocobalamin (VITAMIN B12) 3000 MCG SUBL Take 3,000 mcg by mouth daily.    [provider]  diclofenac Sodium (VOLTAREN) 1 % GEL Apply 2 g topically 4 (four) times daily as needed (hand pain). 09/16/22   Willow Ora, MD  ezetimibe (ZETIA) 10 MG tablet TAKE 1 TABLET BY MOUTH EVERYDAY AT BEDTIME 07/01/22   Willow Ora, MD  losartan (COZAAR) 100 MG tablet TAKE 1 TABLET BY MOUTH EVERY DAY 03/03/23   Willow Ora, MD  Magnesium 250 MG TABS Take 250 mg by mouth daily.    [provider]  MegaRed Omega-3 Krill Oil 500 MG CAPS Take 500 mg by mouth daily.    [provider]  Multiple Vitamin (MULTIVITAMIN) tablet Take 1 tablet by mouth daily.    [provider]  Omega-3 Fatty Acids (FISH OIL) 1200 MG CAPS Take 1,200  mg by mouth daily.    [provider]  Zinc 30 MG TABS Take 30 mg by mouth daily.    [provider]      Allergies    Lipitor [atorvastatin calcium]    Review of Systems   Review of Systems  All other systems reviewed and are negative.   Physical Exam Updated Vital Signs BP (!) 144/93 (BP Location: Right Arm)   Pulse (!) 109   Temp 98.6 F (37 C) (Oral)   Resp 18   SpO2 97%  Physical Exam Vitals and nursing note reviewed.  Constitutional:      General: She is not in acute distress.    Appearance: She is well-developed. She is not diaphoretic.  HENT:     Head: Normocephalic and atraumatic.  Cardiovascular:     Rate and Rhythm: Normal rate and regular rhythm.     Heart sounds: No murmur heard.    No friction rub. No gallop.  Pulmonary:     Effort: Pulmonary effort is normal. No respiratory distress.     Breath sounds: Normal breath sounds. No wheezing.  Abdominal:     General: Bowel sounds are normal. There is no distension.     Palpations: Abdomen is soft.     Tenderness: There is no abdominal tenderness.  Musculoskeletal:        General: Normal range of motion.     Cervical back: Normal range of motion and neck supple.  Skin:    General: Skin is warm and dry.  Neurological:     General: No focal deficit present.     Mental Status: She is alert and oriented to person, place, and time.     ED Results / Procedures / Treatments   Labs (all labs ordered are listed, but only abnormal results are displayed) Labs Reviewed  SARS CORONAVIRUS 2 BY RT PCR    EKG None  Radiology No results found.  Procedures Procedures    Medications Ordered in ED Medications - No data to display  ED Course/ Medical Decision Making/ A&P  Patient presenting with complaints of URI symptoms as described in the HPI.  There is no hypoxia, vital signs are stable, and patient is clinically well-appearing.  COVID test is negative.  She was given a Z-Pak by her  husband which she finished taking yesterday, so I strongly suspect this is viral in nature.  Patient to be discharged with over-the-counter medications and as needed return.  Final Clinical Impression(s) / ED Diagnoses Final diagnoses:  None    Rx / DC Orders ED Discharge Orders     None         Geoffery Lyons, MD 03/04/23 1610    Geoffery Lyons, MD 03/04/23 [phone]

## 2023-03-04 NOTE — ED Triage Notes (Addendum)
Pt presents for fever(23F) and hypertension (SBP 150). Took tylenol pta. Now 144/93 and 98.6. Was 200SBP yesterday.     Endorses cough, itchy throat, body aches Husband sick with similar sx

## 2023-03-04 NOTE — Discharge Instructions (Signed)
Drink plenty of fluids and get plenty of rest.  Take over-the-counter medications as needed for relief of symptoms.  Take Tylenol 1000 mg rotated with ibuprofen 600 mg every 4 hours as needed for pain or fever.  Follow-up with primary doctor if not improving in the next week.

## 2023-03-09 ENCOUNTER — Telehealth: Payer: Self-pay | Admitting: *Deleted

## 2023-03-09 NOTE — Telephone Encounter (Signed)
     Patient  visit on 4/17  at [facility] ed was for treatment  Patient expresses understanding of discharge instructions and education provided has no other needs at this time.    Yehuda Mao [facility]auer -[facility], Population [facility] [phone] [address] [place] , [place] [address] Email : [email or web address]

## 2023-03-17 ENCOUNTER — Encounter: Payer: Self-pay | Admitting: Family Medicine

## 2023-03-17 ENCOUNTER — Ambulatory Visit (INDEPENDENT_AMBULATORY_CARE_PROVIDER_SITE_OTHER): Payer: Medicare PPO | Admitting: Family Medicine

## 2023-03-17 VITALS — BP 132/78 | HR 71 | Temp 98.1°F | Ht 60.0 in | Wt 143.0 lb

## 2023-03-17 DIAGNOSIS — R7303 Prediabetes: Secondary | ICD-10-CM | POA: Diagnosis not present

## 2023-03-17 DIAGNOSIS — I10 Essential (primary) hypertension: Secondary | ICD-10-CM | POA: Diagnosis not present

## 2023-03-17 DIAGNOSIS — M791 Myalgia, unspecified site: Secondary | ICD-10-CM

## 2023-03-17 DIAGNOSIS — J069 Acute upper respiratory infection, unspecified: Secondary | ICD-10-CM | POA: Diagnosis not present

## 2023-03-17 DIAGNOSIS — T466X5A Adverse effect of antihyperlipidemic and antiarteriosclerotic drugs, initial encounter: Secondary | ICD-10-CM

## 2023-03-17 LAB — POCT GLYCOSYLATED HEMOGLOBIN (HGB A1C): Hemoglobin A1C: 6.1 % — AB (ref 4.0–5.6)

## 2023-03-17 MED ORDER — AMLODIPINE BESYLATE 5 MG PO TABS: 5.0000 mg | ORAL_TABLET | Freq: Every day | ORAL | 3 refills | Status: DC

## 2023-03-17 NOTE — Progress Notes (Signed)
Subjective  CC:  Chief Complaint  Patient presents with   Hypertension   Prediabetes    HPI: Kelli Mcintyre is a 72 y.o. female who presents to the office today to address the problems listed above in the chief complaint. Hypertension f/u: Control is good . Pt reports she is doing well. taking medications as instructed, no medication side effects noted, no TIAs, no chest pain on exertion, no dyspnea on exertion, no swelling of ankles. Home readings are normal. Has mild white coat affect. She denies adverse effects from his BP medications. Compliance with medication is good.  Reviewed recent ed notes: uri sxs have resolved. Pt's husband still worried she could have pneumonia.  Patient denies any further symptoms of cough, fever or shortness of breath.  Feels well.  She did complete a Z-Pak also Prediabetes: Exercises intermittently.  No symptoms of hyperglycemia.  Tries to eat well but does have many sweets options with her church activities.  Weight is stable  Assessment  1. Essential hypertension   2. Prediabetes   3. Myalgia due to statin   4. Viral URI      Plan   Hypertension f/u: BP control is well controlled.  Has mild whitecoat component.  Continue losartan 100 and amlodipine 5.  Refills today. Prediabetes with A1c at 6.1 today.  Continue to have educate on diet choices.  Continue exercise Reassured, URI was viral and has resolved.  Lungs are clear today.  Outpatient Encounter Medications as of 03/17/2023  Medication Sig   Ascorbic Acid (VITAMIN C) 1000 MG tablet Take 1,000 mg by mouth daily.   aspirin 81 MG chewable tablet 1 tablet   Cholecalciferol (VITAMIN D3) 50 MCG (2000 UT) TABS Take 50 mcg by mouth daily.   Cyanocobalamin (VITAMIN B12) 3000 MCG SUBL Take 3,000 mcg by mouth daily.   diclofenac Sodium (VOLTAREN) 1 % GEL Apply 2 g topically 4 (four) times daily as needed (hand pain).   ezetimibe (ZETIA) 10 MG tablet TAKE 1 TABLET BY MOUTH EVERYDAY AT BEDTIME   losartan  (COZAAR) 100 MG tablet TAKE 1 TABLET BY MOUTH EVERY DAY   Magnesium 250 MG TABS Take 250 mg by mouth daily.   Multiple Vitamin (MULTIVITAMIN) tablet Take 1 tablet by mouth daily.   Zinc 30 MG TABS Take 30 mg by mouth daily.   [DISCONTINUED] amLODipine (NORVASC) 5 MG tablet Take 1 tablet (5 mg total) by mouth daily.   amLODipine (NORVASC) 5 MG tablet Take 1 tablet (5 mg total) by mouth daily.   [DISCONTINUED] MegaRed Omega-3 Krill Oil 500 MG CAPS Take 500 mg by mouth daily.   [DISCONTINUED] Omega-3 Fatty Acids (FISH OIL) 1200 MG CAPS Take 1,200 mg by mouth daily.   No facility-administered encounter medications on file as of 03/17/2023.    Education regarding management of these chronic disease states was given. Management strategies discussed on successive visits include dietary and exercise recommendations, goals of achieving and maintaining IBW, and lifestyle modifications aiming for adequate sleep and minimizing stressors.   Follow up: 6 months for your annual complete physical; please come fasting.    Orders Placed This Encounter  Procedures   POCT HgB A1C   Meds ordered this encounter  Medications   amLODipine (NORVASC) 5 MG tablet    Sig: Take 1 tablet (5 mg total) by mouth daily.    Dispense:  90 tablet    Refill:  3      BP Readings from Last 3 Encounters:  03/17/23  132/78  03/04/23 (!) 137/95  09/16/22 128/82   Wt Readings from Last 3 Encounters:  03/17/23 143 lb (64.9 kg)  09/16/22 139 lb 6.4 oz (63.2 kg)  02/17/22 146 lb 6.4 oz (66.4 kg)    Lab Results  Component Value Date   CHOL 173 09/18/2022   CHOL 180 08/19/2021   CHOL 181 01/28/2021   Lab Results  Component Value Date   HDL 53.70 09/18/2022   HDL 56.10 08/19/2021   HDL 55.60 01/28/2021   Lab Results  Component Value Date   LDLCALC 92 09/18/2022   LDLCALC 114 (H) 04/25/2020   LDLCALC 130 (H) 06/28/2019   Lab Results  Component Value Date   TRIG 136.0 09/18/2022   TRIG 264.0 (H) 08/19/2021    TRIG 270.0 (H) 01/28/2021   Lab Results  Component Value Date   CHOLHDL 3 09/18/2022   CHOLHDL 3 08/19/2021   CHOLHDL 3 01/28/2021   Lab Results  Component Value Date   LDLDIRECT 101.0 08/19/2021   LDLDIRECT 96.0 01/28/2021   Lab Results  Component Value Date   CREATININE 0.99 09/18/2022   BUN 20 09/18/2022   NA 140 09/18/2022   K 4.2 09/18/2022   CL 105 09/18/2022   CO2 27 09/18/2022    The 10-year ASCVD risk score (Arnett DK, et al., 2019) is: 16%   Values used to calculate the score:     Age: 11 years     Sex: Female     Is Non-Hispanic African American: No     Diabetic: No     Tobacco smoker: No     Systolic Blood Pressure: 132 mmHg     Is BP treated: Yes     HDL Cholesterol: 53.7 mg/dL     Total Cholesterol: 173 mg/dL  I reviewed the patients updated PMH, FH, and SocHx.    Patient Active Problem List   Diagnosis Date Noted   Mixed hyperlipidemia 12/07/2018    Priority: High   Prediabetes 03/23/2018    Priority: High   Essential hypertension 06/15/2017    Priority: High   Lumbar spinal stenosis 01/27/2020    Priority: Medium    Osteopenia 03/23/2018    Priority: Medium    DJD (degenerative joint disease), lumbar 06/15/2017    Priority: Medium    Overactive bladder 08/19/2021    Priority: Low   Myalgia due to statin 06/28/2019    Priority: Low   Primary osteoarthritis of both hands 09/16/2022   Asteroid hyalosis of right eye 09/11/2021   Pseudophakia of both eyes 09/11/2021   Epiretinal membrane, right eye 09/11/2021    Allergies: Lipitor [atorvastatin calcium]  Social History: Patient  reports that she has never smoked. She has never used smokeless tobacco. She reports that she does not drink alcohol and does not use drugs.  Current Meds  Medication Sig   Ascorbic Acid (VITAMIN C) 1000 MG tablet Take 1,000 mg by mouth daily.   aspirin 81 MG chewable tablet 1 tablet   Cholecalciferol (VITAMIN D3) 50 MCG (2000 UT) TABS Take 50 mcg by mouth  daily.   Cyanocobalamin (VITAMIN B12) 3000 MCG SUBL Take 3,000 mcg by mouth daily.   diclofenac Sodium (VOLTAREN) 1 % GEL Apply 2 g topically 4 (four) times daily as needed (hand pain).   ezetimibe (ZETIA) 10 MG tablet TAKE 1 TABLET BY MOUTH EVERYDAY AT BEDTIME   losartan (COZAAR) 100 MG tablet TAKE 1 TABLET BY MOUTH EVERY DAY   Magnesium 250 MG TABS Take 250  mg by mouth daily.   Multiple Vitamin (MULTIVITAMIN) tablet Take 1 tablet by mouth daily.   Zinc 30 MG TABS Take 30 mg by mouth daily.   [DISCONTINUED] amLODipine (NORVASC) 5 MG tablet Take 1 tablet (5 mg total) by mouth daily.    Review of Systems: Cardiovascular: negative for chest pain, palpitations, leg swelling, orthopnea Respiratory: negative for SOB, wheezing or persistent cough Gastrointestinal: negative for abdominal pain Genitourinary: negative for dysuria or gross hematuria  Objective  Vitals: BP 132/78   Pulse 71   Temp 98.1 F (36.7 C)   Ht 5' (1.524 m)   Wt 143 lb (64.9 kg)   SpO2 94%   BMI 27.93 kg/m  General: no acute distress  Psych:  Alert and oriented, normal mood and affect HEENT:  Normocephalic, atraumatic, supple neck  Cardiovascular:  RRR without murmur. no edema Respiratory:  Good breath sounds bilaterally, CTAB with normal respiratory effort Neurologic:   Mental status is normal Commons side effects, risks, benefits, and alternatives for medications and treatment plan prescribed today were discussed, and the patient expressed understanding of the given instructions. Patient is instructed to call or message via MyChart if he/she has any questions or concerns regarding our treatment plan. No barriers to understanding were identified. We discussed Red Flag symptoms and signs in detail. Patient expressed understanding regarding what to do in case of urgent or emergency type symptoms.  Medication list was reconciled, printed and provided to the patient in AVS. Patient instructions and summary information was  reviewed with the patient as documented in the AVS. This note was prepared with assistance of Dragon voice recognition software. Occasional wrong-word or sound-a-like substitutions may have occurred due to the inherent limitation

## 2023-03-17 NOTE — Patient Instructions (Addendum)
Please return in 6 months for your annual complete physical; please come fasting.   Do not eat too many sweets and keep exercising.  Keep checking your blood pressures at home and take your blood pressure pills together every morning.   If you have any questions or concerns, please don't hesitate to send me a message via MyChart or call the office at [phone]. Thank you for visiting with [place] today! It's our pleasure caring for you.

## 2023-05-05 DIAGNOSIS — H4321 Crystalline deposits in vitreous body, right eye: Secondary | ICD-10-CM | POA: Diagnosis not present

## 2023-05-05 DIAGNOSIS — H35371 Puckering of macula, right eye: Secondary | ICD-10-CM | POA: Diagnosis not present

## 2023-06-21 ENCOUNTER — Other Ambulatory Visit: Payer: Self-pay | Admitting: Family Medicine

## 2023-06-29 ENCOUNTER — Ambulatory Visit (INDEPENDENT_AMBULATORY_CARE_PROVIDER_SITE_OTHER): Payer: Medicare PPO

## 2023-06-29 VITALS — BP 110/70 | HR 89 | Temp 98.0°F | Wt 141.0 lb

## 2023-06-29 DIAGNOSIS — Z1231 Encounter for screening mammogram for malignant neoplasm of breast: Secondary | ICD-10-CM

## 2023-06-29 DIAGNOSIS — Z0000 Encounter for general adult medical examination without abnormal findings: Secondary | ICD-10-CM

## 2023-06-29 NOTE — Patient Instructions (Signed)
Kelli Mcintyre , Thank you for taking time to come for your Medicare Wellness Visit. I appreciate your ongoing commitment to your health goals. Please review the following plan we discussed and let me know if I can assist you in the future.   Referrals/Orders/Follow-Ups/Clinician Recommendations: stay healthy   This is a list of the screening recommended for you and due dates:  Health Maintenance  Topic Date Due   Medicare Annual Wellness Visit  06/27/2020   COVID-19 Vaccine (5 - 2023-24 season) 10/31/2022   Mammogram  04/29/2023   Flu Shot  06/18/2023   DEXA scan (bone density measurement)  05/24/2024   Colon Cancer Screening  05/18/2027   DTaP/Tdap/Td vaccine (2 - Td or Tdap) 12/27/2027   Pneumonia Vaccine  Completed   Zoster (Shingles) Vaccine  Completed   HPV Vaccine  Aged Out   Hepatitis C Screening  Discontinued    Advanced directives: (Provided) Advance directive discussed with you today. I have provided a copy for you to complete at home and have notarized. Once this is complete, please bring a copy in to our office so we can scan it into your chart.   Next Medicare Annual Wellness Visit scheduled for next year: Yes  Preventive Care 22 Years and Older, Female Preventive care refers to lifestyle choices and visits with your health care provider that can promote health and wellness. What does preventive care include? A yearly physical exam. This is also called an annual well check. Dental exams once or twice a year. Routine eye exams. Ask your health care provider how often you should have your eyes checked. Personal lifestyle choices, including: Daily care of your teeth and gums. Regular physical activity. Eating a healthy diet. Avoiding tobacco and drug use. Limiting alcohol use. Practicing safe sex. Taking low-dose aspirin every day. Taking vitamin and mineral supplements as recommended by your health care provider. What happens during an annual well check? The services and  screenings done by your health care provider during your annual well check will depend on your age, overall health, lifestyle risk factors, and family history of disease. Counseling  Your health care provider may ask you questions about your: Alcohol use. Tobacco use. Drug use. Emotional well-being. Home and relationship well-being. Sexual activity. Eating habits. History of falls. Memory and ability to understand (cognition). Work and work Astronomer. Reproductive health. Screening  You may have the following tests or measurements: Height, weight, and BMI. Blood pressure. Lipid and cholesterol levels. These may be checked every 5 years, or more frequently if you are over 19 years old. Skin check. Lung cancer screening. You may have this screening every year starting at age 63 if you have a 30-pack-year history of smoking and currently smoke or have quit within the past 15 years. Fecal occult blood test (FOBT) of the stool. You may have this test every year starting at age 58. Flexible sigmoidoscopy or colonoscopy. You may have a sigmoidoscopy every 5 years or a colonoscopy every 10 years starting at age 59. Hepatitis C blood test. Hepatitis B blood test. Sexually transmitted disease (STD) testing. Diabetes screening. This is done by checking your blood sugar (glucose) after you have not eaten for a while (fasting). You may have this done every 1-3 years. Bone density scan. This is done to screen for osteoporosis. You may have this done starting at age 61. Mammogram. This may be done every 1-2 years. Talk to your health care provider about how often you should have regular mammograms. Talk  with your health care provider about your test results, treatment options, and if necessary, the need for more tests. Vaccines  Your health care provider may recommend certain vaccines, such as: Influenza vaccine. This is recommended every year. Tetanus, diphtheria, and acellular pertussis (Tdap,  Td) vaccine. You may need a Td booster every 10 years. Zoster vaccine. You may need this after age 69. Pneumococcal 13-valent conjugate (PCV13) vaccine. One dose is recommended after age 30. Pneumococcal polysaccharide (PPSV23) vaccine. One dose is recommended after age 6. Talk to your health care provider about which screenings and vaccines you need and how often you need them. This information is not intended to replace advice given to you by your health care provider. Make sure you discuss any questions you have with your health care provider. Document Released: 11/30/2015 Document Revised: 07/23/2016 Document Reviewed: 09/04/2015 Elsevier Interactive Patient Education  2017 [employer].  Fall Prevention in the Home Falls can cause injuries. They can happen to people of all ages. There are many things you can do to make your home safe and to help prevent falls. What can I do on the outside of my home? Regularly fix the edges of walkways and driveways and fix any cracks. Remove anything that might make you trip as you walk through a door, such as a raised step or threshold. Trim any bushes or trees on the path to your home. Use bright outdoor lighting. Clear any walking paths of anything that might make someone trip, such as rocks or tools. Regularly check to see if handrails are loose or broken. Make sure that both sides of any steps have handrails. Any raised decks and porches should have guardrails on the edges. Have any leaves, snow, or ice cleared regularly. Use sand or salt on walking paths during winter. Clean up any spills in your garage right away. This includes oil or grease spills. What can I do in the bathroom? Use night lights. Install grab bars by the toilet and in the tub and shower. Do not use towel bars as grab bars. Use non-skid mats or decals in the tub or shower. If you need to sit down in the shower, use a plastic, non-slip stool. Keep the floor dry. Clean up any  water that spills on the floor as soon as it happens. Remove soap buildup in the tub or shower regularly. Attach bath mats securely with double-sided non-slip rug tape. Do not have throw rugs and other things on the floor that can make you trip. What can I do in the bedroom? Use night lights. Make sure that you have a light by your bed that is easy to reach. Do not use any sheets or blankets that are too big for your bed. They should not hang down onto the floor. Have a firm chair that has side arms. You can use this for support while you get dressed. Do not have throw rugs and other things on the floor that can make you trip. What can I do in the kitchen? Clean up any spills right away. Avoid walking on wet floors. Keep items that you use a lot in easy-to-reach places. If you need to reach something above you, use a strong step stool that has a grab bar. Keep electrical cords out of the way. Do not use floor polish or wax that makes floors slippery. If you must use wax, use non-skid floor wax. Do not have throw rugs and other things on the floor that can make you  trip. What can I do with my stairs? Do not leave any items on the stairs. Make sure that there are handrails on both sides of the stairs and use them. Fix handrails that are broken or loose. Make sure that handrails are as long as the stairways. Check any carpeting to make sure that it is firmly attached to the stairs. Fix any carpet that is loose or worn. Avoid having throw rugs at the top or bottom of the stairs. If you do have throw rugs, attach them to the floor with carpet tape. Make sure that you have a light switch at the top of the stairs and the bottom of the stairs. If you do not have them, ask someone to add them for you. What else can I do to help prevent falls? Wear shoes that: Do not have high heels. Have rubber bottoms. Are comfortable and fit you well. Are closed at the toe. Do not wear sandals. If you use a  stepladder: Make sure that it is fully opened. Do not climb a closed stepladder. Make sure that both sides of the stepladder are locked into place. Ask someone to hold it for you, if possible. Clearly mark and make sure that you can see: Any grab bars or handrails. First and last steps. Where the edge of each step is. Use tools that help you move around (mobility aids) if they are needed. These include: Canes. Walkers. Scooters. Crutches. Turn on the lights when you go into a dark area. Replace any light bulbs as soon as they burn out. Set up your furniture so you have a clear path. Avoid moving your furniture around. If any of your floors are uneven, fix them. If there are any pets around you, be aware of where they are. Review your medicines with your doctor. Some medicines can make you feel dizzy. This can increase your chance of falling. Ask your doctor what other things that you can do to help prevent falls. This information is not intended to replace advice given to you by your health care provider. Make sure you discuss any questions you have with your health care provider. Document Released: 08/30/2009 Document Revised: 04/10/2016 Document Reviewed: 12/08/2014 Elsevier Interactive Patient Education  2017 [employer].

## 2023-06-29 NOTE — Progress Notes (Signed)
Subjective:   Kelli Mcintyre is a 72 y.o. female who presents for Medicare Annual (Subsequent) preventive examination.  Visit Complete: In person  Patient Medicare AWV questionnaire was completed by the patient on 06/28/23; I have confirmed that all information answered by patient is correct and no changes since this date.  Review of Systems     Cardiac Risk Factors include: advanced age (>6men, >50 women);dyslipidemia;hypertension     Objective:    Today's Vitals   06/29/23 1001  BP: 110/70  Pulse: 89  Temp: 98 F (36.7 C)  SpO2: 98%  Weight: 141 lb (64 kg)   Body mass index is 27.54 kg/m.     06/29/2023   10:11 AM 03/04/2023    1:02 AM 06/28/2019   11:59 AM 12/24/2017    9:35 AM  Advanced Directives  Does Patient Have a Medical Advance Directive? No No No Yes  Would patient like information on creating a medical advance directive? Yes (MAU/Ambulatory/Procedural Areas - Information given) No - Patient declined      Current Medications (verified) Outpatient Encounter Medications as of 06/29/2023  Medication Sig   amLODipine (NORVASC) 5 MG tablet Take 1 tablet (5 mg total) by mouth daily.   Ascorbic Acid (VITAMIN C) 1000 MG tablet Take 1,000 mg by mouth daily.   aspirin 81 MG chewable tablet 1 tablet   Cholecalciferol (VITAMIN D3) 50 MCG (2000 UT) TABS Take 50 mcg by mouth daily.   Cyanocobalamin (VITAMIN B12) 3000 MCG SUBL Take 3,000 mcg by mouth daily.   diclofenac Sodium (VOLTAREN) 1 % GEL Apply 2 g topically 4 (four) times daily as needed (hand pain).   ezetimibe (ZETIA) 10 MG tablet TAKE 1 TABLET BY MOUTH EVERYDAY AT BEDTIME   losartan (COZAAR) 100 MG tablet TAKE 1 TABLET BY MOUTH EVERY DAY   Magnesium 250 MG TABS Take 250 mg by mouth daily.   Multiple Vitamin (MULTIVITAMIN) tablet Take 1 tablet by mouth daily.   Zinc 30 MG TABS Take 30 mg by mouth daily.   No facility-administered encounter medications on file as of 06/29/2023.    Allergies (verified) Lipitor  [atorvastatin calcium]   History: Past Medical History:  Diagnosis Date   Hyperlipemia    Dr. Pearson Grippe    Hypertension    Lumbar spinal stenosis 01/27/2020   Overactive bladder 08/19/2021   Mild; monitoring 08/2021   Urinary incontinence    Past Surgical History:  Procedure Laterality Date   CATARACT EXTRACTION N/A    TUBAL LIGATION     Family History  Problem Relation Age of Onset   Diabetes Mother    Hypertension Mother    Social History   Socioeconomic History   Marital status: Married    Spouse name: Not on file   Number of children: Not on file   Years of education: Not on file   Highest education level: Not on file  Occupational History   Not on file  Tobacco Use   Smoking status: Never   Smokeless tobacco: Never  Vaping Use   Vaping status: Never Used  Substance and Sexual Activity   Alcohol use: No   Drug use: No   Sexual activity: Not Currently    Birth control/protection: Surgical    Comment: tubal ligation  Other Topics Concern   Not on file  Social History Narrative   Not on file   Social Determinants of Health   Financial Resource Strain: Low Risk  (06/28/2023)   Overall Physicist, medical Strain (  CARDIA)    Difficulty of Paying Living Expenses: Not hard at all  Food Insecurity: No Food Insecurity (06/28/2023)   Hunger Vital Sign    Worried About Running Out of Food in the Last Year: Never true    Ran Out of Food in the Last Year: Never true  Transportation Needs: No Transportation Needs (06/28/2023)   PRAPARE - Administrator, Civil Service (Medical): No    Lack of Transportation (Non-Medical): No  Physical Activity: Patient Declined (06/28/2023)   Exercise Vital Sign    Days of Exercise per Week: Patient declined    Minutes of Exercise per Session: Patient declined  Stress: No Stress Concern Present (06/28/2023)   [employer] of Occupational Health - Occupational Stress Questionnaire    Feeling of Stress : Only a little   Social Connections: Socially Integrated (06/28/2023)   Social Connection and Isolation Panel [NHANES]    Frequency of Communication with Friends and Family: More than three times a week    Frequency of Social Gatherings with Friends and Family: More than three times a week    Attends Religious Services: More than 4 times per year    Active Member of [employer] or Organizations: Yes    Attends Engineer, structural: More than 4 times per year    Marital Status: Married    Tobacco Counseling Counseling given: Not Answered   Clinical Intake:  Pre-visit preparation completed: Yes  Pain : No/denies pain     BMI - recorded: 27.54 Nutritional Status: BMI 25 -29 Overweight Nutritional Risks: None Diabetes: No  How often do you need to have someone help you when you read instructions, pamphlets, or other written materials from your doctor or pharmacy?: 3 - Sometimes  Interpreter Needed?: Yes Interpreter Agency: [employer] Interpreter Name: [employer] Patient Declined Interpreter : No Patient signed [place] waiver: Yes  Information entered by :: Lanier Ensign, LPN   Activities of Daily Living    06/28/2023    8:06 PM  In your present state of health, do you have any difficulty performing the following activities:  Hearing? 0  Vision? 0  Difficulty concentrating or making decisions? 0  Walking or climbing stairs? 0  Dressing or bathing? 0  Doing errands, shopping? 0  Preparing Food and eating ? N  Using the Toilet? N  In the past six months, have you accidently leaked urine? N  Do you have problems with loss of bowel control? N  Managing your Medications? N  Managing your Finances? N  Housekeeping or managing your Housekeeping? N    Patient Care Team: Willow Ora, MD as PCP - General (Family Medicine)  Indicate any recent Medical Services you may have received from other than [facility] providers in the past year (date may be approximate).     Assessment:    This is a routine wellness examination for Kelli Mcintyre.  Hearing/Vision screen Hearing Screening - Comments:: Pt denies any hearing issues  Vision Screening - Comments:: Pt follows up with Dr Luciana Axe for annual eye exams   Dietary issues and exercise activities discussed:     Goals Addressed             This Visit's Progress    Patient Stated       Stay healthy        Depression Screen    06/29/2023   10:08 AM 03/17/2023    9:36 AM 09/16/2022    8:52 AM 02/17/2022    8:34  AM 01/28/2021    9:50 AM 07/27/2020   11:24 AM 06/28/2019    9:34 AM  PHQ 2/9 Scores  PHQ - 2 Score 0 0 0 0 0 0 0    Fall Risk    06/28/2023    8:06 PM 03/17/2023    9:36 AM 09/16/2022    8:52 AM 02/17/2022    8:29 AM 01/28/2021    9:49 AM  Fall Risk   Falls in the past year? 0 0 0 0 0  Number falls in past yr: 0 0 0 0 0  Injury with Fall? 0 0 0 0 0  Risk for fall due to : Impaired vision No Fall Risks No Fall Risks No Fall Risks   Follow up Falls prevention discussed Falls evaluation completed Falls evaluation completed Falls evaluation completed     MEDICARE RISK AT HOME:   TIMED UP AND GO:  Was the test performed?  Yes  Length of time to ambulate 10 feet: 10 sec Gait steady and fast without use of assistive device    Cognitive Function:DECLINED    12/24/2017    9:44 AM  MMSE - Mini Mental State Exam  Not completed: --        06/29/2023   10:13 AM  6CIT Screen  What Year? 0 points  What month? 0 points    Immunizations Immunization History  Administered Date(s) Administered   Fluad Quad(high Dose 65+) 08/19/2021, 09/13/2022   Hepatitis A, Adult 11/23/2013, 05/23/2014   Influenza Split 08/25/2015, 08/30/2016   Influenza, High Dose Seasonal PF 08/20/2018, 08/16/2019   Influenza,inj,Quad PF,6+ Mos 07/29/2017, 07/22/2020   Influenza-Unspecified 09/15/2012, 08/11/2013, 08/21/2014   Moderna Covid-19 Vaccine Bivalent Booster 39yrs & up 09/05/2022   Moderna Sars-Covid-2 Vaccination  12/29/2019, 01/31/2020, 03/15/2021   Pneumococcal Conjugate-13 10/30/2014, 12/26/2017   Pneumococcal Polysaccharide-23 12/26/2018   Respiratory Syncytial Virus Vaccine,Recomb Aduvanted(Arexvy) 10/07/2022   Tdap 12/26/2017   Zoster Recombinant(Shingrix) 02/10/2018, 06/08/2018   Zoster, Live 07/26/2013    TDAP status: Up to date  Flu Vaccine status: Due, Education has been provided regarding the importance of this vaccine. Advised may receive this vaccine at local pharmacy or Health Dept. Aware to provide a copy of the vaccination record if obtained from local pharmacy or Health Dept. Verbalized acceptance and understanding.  Pneumococcal vaccine status: Up to date  Covid-19 vaccine status: Completed vaccines  Qualifies for Shingles Vaccine? Yes   Zostavax completed Yes   Shingrix Completed?: Yes  Screening Tests Health Maintenance  Topic Date Due   COVID-19 Vaccine (5 - 2023-24 season) 10/31/2022   MAMMOGRAM  04/29/2023   INFLUENZA VACCINE  06/18/2023   DEXA SCAN  05/24/2024   Medicare Annual Wellness (AWV)  06/28/2024   Colonoscopy  05/18/2027   DTaP/Tdap/Td (2 - Td or Tdap) 12/27/2027   Pneumonia Vaccine 77+ Years old  Completed   Zoster Vaccines- Shingrix  Completed   HPV VACCINES  Aged Out   Hepatitis C Screening  Discontinued    Health Maintenance  Health Maintenance Due  Topic Date Due   COVID-19 Vaccine (5 - 2023-24 season) 10/31/2022   MAMMOGRAM  04/29/2023   INFLUENZA VACCINE  06/18/2023    Colorectal cancer screening: Type of screening: Colonoscopy. Completed 05/17/17. Repeat every 10 years  Mammogram status: Ordered 06/29/23. Pt provided with contact info and advised to call to schedule appt.   Bone Density status: Completed 05/24/21. Results reflect: Bone density results: OSTEOPENIA. Repeat every 2 years.   Additional Screening:  Hepatitis C Screening:  Completed 03/23/18  Vision Screening: Recommended annual ophthalmology exams for early detection of  glaucoma and other disorders of the eye. Is the patient up to date with their annual eye exam?  Yes  Who is the provider or what is the name of the office in which the patient attends annual eye exams? Dr Luciana Axe  If pt is not established with a provider, would they like to be referred to a provider to establish care? No .   Dental Screening: Recommended annual dental exams for proper oral hygiene   Community Resource Referral / Chronic Care Management: CRR required this visit?  No   CCM required this visit?  No     Plan:     I have personally reviewed and noted the following in the patient's chart:   Medical and social history Use of alcohol, tobacco or illicit drugs  Current medications and supplements including opioid prescriptions. Patient is not currently taking opioid prescriptions. Functional ability and status Nutritional status Physical activity Advanced directives List of other physicians Hospitalizations, surgeries, and ER visits in previous 12 months Vitals Screenings to include cognitive, depression, and falls Referrals and appointments  In addition, I have reviewed and discussed with patient certain preventive protocols, quality metrics, and best practice recommendations. A written personalized care plan for preventive services as well as general preventive health recommendations were provided to patient.     Marzella Schlein, LPN   [id number]   After Visit Summary: (MyChart) Due to this being a telephonic visit, the after visit summary with patients personalized plan was offered to patient via MyChart   Nurse Notes:  pt declined cognition testing pt is very knowledgeable with questions asked and aware of date and time .

## 2023-07-01 ENCOUNTER — Ambulatory Visit
Admission: RE | Admit: 2023-07-01 | Discharge: 2023-07-01 | Disposition: A | Discharge status: Home / Self Care | Payer: Medicare PPO | Source: Ambulatory Visit | Attending: Family Medicine | Admitting: Family Medicine

## 2023-07-01 DIAGNOSIS — Z1231 Encounter for screening mammogram for malignant neoplasm of breast: Secondary | ICD-10-CM

## 2023-08-13 DIAGNOSIS — Z2989 Encounter for other specified prophylactic measures: Secondary | ICD-10-CM | POA: Diagnosis not present

## 2023-08-13 DIAGNOSIS — Z23 Encounter for immunization: Secondary | ICD-10-CM | POA: Diagnosis not present

## 2023-09-23 ENCOUNTER — Encounter: Payer: Self-pay | Admitting: Family Medicine

## 2023-09-23 ENCOUNTER — Ambulatory Visit: Payer: Medicare PPO | Admitting: Family Medicine

## 2023-09-23 VITALS — BP 126/72 | HR 75 | Temp 97.9°F | Ht 60.0 in | Wt 137.6 lb

## 2023-09-23 DIAGNOSIS — I10 Essential (primary) hypertension: Secondary | ICD-10-CM

## 2023-09-23 DIAGNOSIS — M48061 Spinal stenosis, lumbar region without neurogenic claudication: Secondary | ICD-10-CM

## 2023-09-23 DIAGNOSIS — G720 Drug-induced myopathy: Secondary | ICD-10-CM | POA: Diagnosis not present

## 2023-09-23 DIAGNOSIS — M8580 Other specified disorders of bone density and structure, unspecified site: Secondary | ICD-10-CM

## 2023-09-23 DIAGNOSIS — E782 Mixed hyperlipidemia: Secondary | ICD-10-CM

## 2023-09-23 DIAGNOSIS — Z0001 Encounter for general adult medical examination with abnormal findings: Secondary | ICD-10-CM | POA: Diagnosis not present

## 2023-09-23 DIAGNOSIS — R7303 Prediabetes: Secondary | ICD-10-CM | POA: Diagnosis not present

## 2023-09-23 DIAGNOSIS — T466X5A Adverse effect of antihyperlipidemic and antiarteriosclerotic drugs, initial encounter: Secondary | ICD-10-CM

## 2023-09-23 DIAGNOSIS — N3281 Overactive bladder: Secondary | ICD-10-CM | POA: Diagnosis not present

## 2023-09-23 LAB — TSH: TSH: 4.45 u[IU]/mL (ref 0.35–5.50)

## 2023-09-23 LAB — COMPREHENSIVE METABOLIC PANEL
ALT: 17 U/L (ref 0–35)
AST: 22 U/L (ref 0–37)
Albumin: 4.3 g/dL (ref 3.5–5.2)
Alkaline Phosphatase: 48 U/L (ref 39–117)
BUN: 21 mg/dL (ref 6–23)
CO2: 28 meq/L (ref 19–32)
Calcium: 9.7 mg/dL (ref 8.4–10.5)
Chloride: 103 meq/L (ref 96–112)
Creatinine, Ser: 1.08 mg/dL (ref 0.40–1.20)
GFR: 50.96 mL/min — ABNORMAL LOW (ref >60.00)
Glucose, Bld: 95 mg/dL (ref 70–99)
Potassium: 4.2 meq/L (ref 3.5–5.1)
Sodium: 138 meq/L (ref 135–145)
Total Bilirubin: 0.6 mg/dL (ref 0.2–1.2)
Total Protein: 7.7 g/dL (ref 6.0–8.3)

## 2023-09-23 LAB — CBC WITH DIFFERENTIAL/PLATELET
Basophils Absolute: 0 10*3/uL (ref 0.0–0.1)
Basophils Relative: 0.5 % (ref 0.0–3.0)
Eosinophils Absolute: 0.1 10*3/uL (ref 0.0–0.7)
Eosinophils Relative: 1.7 % (ref 0.0–5.0)
HCT: 42.5 % (ref 36.0–46.0)
Hemoglobin: 13.7 g/dL (ref 12.0–15.0)
Lymphocytes Relative: 29.8 % (ref 12.0–46.0)
Lymphs Abs: 2.4 10*3/uL (ref 0.7–4.0)
MCHC: 32.2 g/dL (ref 30.0–36.0)
MCV: 89.6 fL (ref 78.0–100.0)
Monocytes Absolute: 0.5 10*3/uL (ref 0.1–1.0)
Monocytes Relative: 6.6 % (ref 3.0–12.0)
Neutro Abs: 4.9 10*3/uL (ref 1.4–7.7)
Neutrophils Relative %: 61.4 % (ref 43.0–77.0)
Platelets: 251 10*3/uL (ref 150.0–400.0)
RBC: 4.74 Mil/uL (ref 3.87–5.11)
RDW: 13.7 % (ref 11.5–15.5)
WBC: 8 10*3/uL (ref 4.0–10.5)

## 2023-09-23 LAB — LIPID PANEL
Cholesterol: 198 mg/dL (ref 0–200)
HDL: 57.3 mg/dL (ref >39.00)
LDL Cholesterol: 110 mg/dL — ABNORMAL HIGH (ref 0–99)
NonHDL: 141.19
Total CHOL/HDL Ratio: 3
Triglycerides: 155 mg/dL — ABNORMAL HIGH (ref 0.0–149.0)
VLDL: 31 mg/dL (ref 0.0–40.0)

## 2023-09-23 LAB — HEMOGLOBIN A1C: Hgb A1c MFr Bld: 6.4 % (ref 4.6–6.5)

## 2023-09-23 NOTE — Progress Notes (Signed)
Subjective  Chief Complaint  Patient presents with   Annual Exam   Hyperlipidemia   Prediabetes    HPI: Kelli Mcintyre is a 72 y.o. female who presents to [facility] [place] today for a Female Wellness Visit. She also has the concerns and/or needs as listed above in the chief complaint. These will be addressed in addition to the Health Maintenance Visit.   Wellness Visit: annual visit with health maintenance review and exam  HM: screens are all current. Imms up to date Chronic disease f/u and/or acute problem visit: (deemed necessary to be done in addition to the wellness visit): Discussed the use of AI scribe software for clinical note transcription with the patient, who gave verbal consent to proceed.  History of Present Illness   the use of a video interpreter helped with this interview and visit.  The patient, with a history of hypertension, hyperlipidemia, and prediabetes, presents for a routine check-up. She reports no new health issues and is adhering to her prescribed medication regimen. She has been experiencing constipation when taking calcium supplements with vitamin D. She is currently taking vitamin D separately and is seeking a recommendation for a calcium supplement that does not cause constipation.  The patient also reports concerns about her prediabetes status. She is making efforts to limit her intake of sweets and carbohydrates, particularly during social events at her church. She expresses a desire to avoid developing diabetes and is open to dietary advice to help manage her prediabetes.  HTN is controlled on losartan and amlodipine. no cp or sob or edema or adverse effects.    on zetia for lipid mgt. no adverse effects.   The patient's memory is reported to be generally good, with occasional lapses. She reports no worsening of bladder function compared to the previous year and no increased joint pain. (OAB and OA are stable)    osteopenia: She is  due for a bone density test next year, which she undergoes biennially.   HM: screens and imms are current.       Assessment  1. Encounter for well adult exam with abnormal findings   2. Essential hypertension   3. Mixed hyperlipidemia   4. Prediabetes   5. Spinal stenosis of lumbar region without neurogenic claudication   6. Osteopenia, unspecified location   7. Statin myopathy   8. Overactive bladder      Plan  Female Wellness Visit: Age appropriate Health Maintenance and Prevention measures were discussed with patient. Included topics are cancer screening recommendations, ways to keep healthy (see AVS) including dietary and exercise recommendations, regular eye and dental care, use of seat belts, and avoidance of moderate alcohol use and tobacco use.  BMI: discussed patient's BMI and encouraged positive lifestyle modifications to help get to or maintain a target BMI. HM needs and immunizations were addressed and ordered. See below for orders. See HM and immunization section for updates. Routine labs and screening tests ordered including cmp, cbc and lipids where appropriate. Discussed recommendations regarding Vit D and calcium supplementation (see AVS)  Chronic disease management visit and/or acute problem visit: Assessment and Plan    Wellness Check Routine wellness check. Reports feeling well and adherent to medications. Blood pressure is well-controlled. All previous screenings (mammogram, bone density, colonoscopy) are up to date. Bone density test due next year. - Perform physical examination - Order blood work to check glucose levels  Prediabetes Blood tests indicate prediabetes. Emphasized dietary management to prevent progression to  diabetes. Discussed importance of avoiding sweets and sweetened beverages, increasing vegetables and grains, and reducing portion sizes of rice and carbohydrates. - Order blood glucose test - Advise on dietary modifications: increase  vegetables, grains, reduce portion sizes of rice and carbohydrates, avoid sweets and sweetened beverages  Hypertension Hypertension is well-controlled with current medication regimen. continue losartan 100 and amlodipine 10 daily. check renal function and lytes - Continue current antihypertensive medications  Hyperlipidemia Hyperlipidemia is well-controlled with current medication regimen. - Continue current lipid-lowering medications, zetia 10 qhs  Calcium Supplementation Experiences constipation with calcium supplements containing vitamin D. Currently taking vitamin D separately. Recommended Oscal (calcium supplement without vitamin D) as it tends to cause less constipation. Advised to take Oscal three times a week initially and increase if well-tolerated. Encouraged high fiber diet to mitigate constipation. - Recommend Oscal (calcium supplement without vitamin D) - Advise to take Oscal three times a week initially and increase if well-tolerated - Encourage high fiber diet to mitigate constipation   OA and OAB are stable. supportive care w/o RX medications.   General Health Maintenance General health maintenance is up to date. Bone density test due next year. - Schedule bone density test for next year  Follow-up - Review lab results on MyChart - Schedule next wellness check in one year.       Follow up: 12 mo for cpe  Orders Placed This Encounter  Procedures   CBC with Differential/Platelet   Comprehensive metabolic panel   Lipid panel   Hemoglobin A1c   TSH   No orders of the defined types were placed in this encounter.     Body mass index is 26.87 kg/m. Wt Readings from Last 3 Encounters:  09/23/23 137 lb 9.6 oz (62.4 kg)  06/29/23 141 lb (64 kg)  03/17/23 143 lb (64.9 kg)     Patient Active Problem List   Diagnosis Date Noted Date Diagnosed   Mixed hyperlipidemia 12/07/2018     Priority: High   Prediabetes 03/23/2018     Priority: High   Essential  hypertension 06/15/2017     Priority: High    Has white coat component at times. Checks daily at home    Lumbar spinal stenosis 01/27/2020     Priority: Medium    Osteopenia 03/23/2018     Priority: Medium     Dexa 05/2021: lowest T = -1.1; recheck 2-3 years    DJD (degenerative joint disease), lumbar 06/15/2017     Priority: Medium     MRI LUMBAR 06/29/17: 1. Lumbar facet arthropathy most advanced at L4-5 where there is anterolisthesis. Milder disc degeneration with generalized disc bulging. 2. L4-5 advanced spinal stenosis. Moderate right foraminal impingement. 3. Noncompressive degenerative changes described above.      Overactive bladder 08/19/2021     Priority: Low    Mild; monitoring 08/2021    Myalgia due to statin 06/28/2019     Priority: Low   Primary osteoarthritis of both hands 09/16/2022    Asteroid hyalosis of right eye 09/11/2021    Pseudophakia of both eyes 09/11/2021    Epiretinal membrane, right eye 09/11/2021    Health Maintenance  Topic Date Due   COVID-19 Vaccine (5 - 2023-24 season) 10/09/2023 (Originally 07/19/2023)   DEXA SCAN  05/24/2024   Medicare Annual Wellness (AWV)  06/28/2024   MAMMOGRAM  06/30/2024   Colonoscopy  05/18/2027   DTaP/Tdap/Td (2 - Td or Tdap) 12/27/2027   Pneumonia Vaccine 40+ Years old  Completed   INFLUENZA VACCINE  Completed   Zoster Vaccines- Shingrix  Completed   HPV VACCINES  Aged Out   Hepatitis C Screening  Discontinued   Immunization History  Administered Date(s) Administered   Fluad Quad(high Dose 65+) 08/19/2021, 09/13/2022   Fluad Trivalent(High Dose 65+) 09/02/2023   Hepatitis A, Adult 11/23/2013, 05/23/2014   Influenza Split 08/25/2015, 08/30/2016   Influenza, High Dose Seasonal PF 08/20/2018, 08/16/2019, 08/13/2023   Influenza,inj,Quad PF,6+ Mos 07/29/2017, 07/22/2020   Influenza-Unspecified 09/15/2012, 08/11/2013, 08/21/2014   Moderna Covid-19 Vaccine Bivalent Booster 77yrs & up 09/05/2022   Moderna  Sars-Covid-2 Vaccination 12/29/2019, 01/31/2020, 03/15/2021   Pneumococcal Conjugate-13 10/30/2014, 12/26/2017   Pneumococcal Polysaccharide-23 12/26/2018   Respiratory Syncytial Virus Vaccine,Recomb Aduvanted(Arexvy) 10/07/2022   Tdap 12/26/2017   Zoster Recombinant(Shingrix) 02/10/2018, 06/08/2018   Zoster, Live 07/26/2013   We updated and reviewed the patient's past history in detail and it is documented below. Allergies: Patient is allergic to lipitor [atorvastatin calcium]. Past Medical History Patient  has a past medical history of Hyperlipemia, Hypertension, Lumbar spinal stenosis (01/27/2020), Overactive bladder (08/19/2021), and Urinary incontinence. Past Surgical History Patient  has a past surgical history that includes Cataract extraction (N/A) and Tubal ligation. Family History: Patient family history includes Diabetes in her mother; Hypertension in her mother. Social History:  Patient  reports that she has never smoked. She has never used smokeless tobacco. She reports that she does not drink alcohol and does not use drugs.  Review of Systems: Constitutional: negative for fever or malaise Ophthalmic: negative for photophobia, double vision or loss of vision Cardiovascular: negative for chest pain, dyspnea on exertion, or new LE swelling Respiratory: negative for SOB or persistent cough Gastrointestinal: negative for abdominal pain, change in bowel habits or melena Genitourinary: negative for dysuria or gross hematuria, no abnormal uterine bleeding or disharge Musculoskeletal: negative for new gait disturbance or muscular weakness Integumentary: negative for new or persistent rashes, no breast lumps Neurological: negative for TIA or stroke symptoms Psychiatric: negative for SI or delusions Allergic/Immunologic: negative for hives  Patient Care Team    Relationship Specialty Notifications Start End  Willow Ora, MD PCP - General Family Medicine  01/27/20      Objective  Vitals: BP 126/72   Pulse 75   Temp 97.9 F (36.6 C)   Ht 5' (1.524 m)   Wt 137 lb 9.6 oz (62.4 kg)   SpO2 97%   BMI 26.87 kg/m  General:  Well developed, well nourished, no acute distress  Psych:  Alert and orientedx3,normal mood and affect HEENT:  Normocephalic, atraumatic, non-icteric sclera,  supple neck without adenopathy, mass or thyromegaly Cardiovascular:  Normal S1, S2, RRR without gallop, rub has +1 systolic murmur at rusb, non radiating Respiratory:  Good breath sounds bilaterally, CTAB with normal respiratory effort Gastrointestinal: normal bowel sounds, soft, non-tender, no noted masses. No HSM MSK: extremities without edema, joints without erythema or swelling Neurologic:    Mental status is normal.  Gross motor and sensory exams are normal.  No tremor  Commons side effects, risks, benefits, and alternatives for medications and treatment plan prescribed today were discussed, and the patient expressed understanding of the given instructions. Patient is instructed to call or message via MyChart if he/she has any questions or concerns regarding our treatment plan. No barriers to understanding were identified. We discussed Red Flag symptoms and signs in detail. Patient expressed understanding regarding what to do in case of urgent or emergency type symptoms.  Medication list was reconciled, printed and provided to the patient  in AVS. Patient instructions and summary information was reviewed with the patient as documented in the AVS. This note was prepared with assistance of Dragon voice recognition software. Occasional wrong-word or sound-a-like substitutions may have occurred due to the inherent limitations of voice recognition software

## 2023-09-23 NOTE — Patient Instructions (Signed)
Please return in 12 months for your annual complete physical; please come fasting.   If you have any questions or concerns, please don't hesitate to send me a message via MyChart or call the office at [phone]. Thank you for visiting with [place] today! It's our pleasure caring for you.   VISIT SUMMARY:  During your routine check-up, we discussed your overall health and addressed your concerns about prediabetes, hypertension, hyperlipidemia, and calcium supplementation. Your blood pressure and cholesterol levels are well-controlled with your current medications. We also talked about managing your prediabetes through dietary changes and found a new calcium supplement to help with constipation.  YOUR PLAN:  -WELLNESS CHECK: This was a routine check-up to review your overall health. Your blood pressure is well-controlled, and all previous screenings are up to date. We performed a physical examination and ordered blood work to check your glucose levels.  -PREDIABETES: Prediabetes means your blood sugar levels are higher than normal but not high enough to be classified as diabetes. We discussed the importance of dietary management to prevent progression to diabetes. You should avoid sweets and sweetened beverages, increase your intake of vegetables and grains, and reduce portion sizes of rice and carbohydrates. We also ordered a blood glucose test.  -HYPERTENSION: Hypertension, or high blood pressure, is well-controlled with your current medication regimen. Continue taking your antihypertensive medications as prescribed.  -HYPERLIPIDEMIA: Hyperlipidemia means you have high levels of lipids (fats) in your blood. Your condition is well-controlled with your current medication regimen. Continue taking your lipid-lowering medications as prescribed.  -CALCIUM SUPPLEMENTATION: You have been experiencing constipation with calcium supplements containing vitamin D. We recommended Oscal, a calcium supplement  without vitamin D, which tends to cause less constipation. Start by taking Oscal three times a week and increase if well-tolerated. Additionally, maintain a high fiber diet to help with constipation.  -GENERAL HEALTH MAINTENANCE: Your general health maintenance is up to date. You are due for a bone density test next year.  INSTRUCTIONS:  Please review your lab results on MyChart once they are available. Schedule your next wellness check in one year.

## 2023-09-25 NOTE — Progress Notes (Signed)
Please call patient: her results show she is nearing the diabetic range: so needs to limit sweets as we discussed. Please have her schedule another visit in 6 months to recheck. Her other lab results are all fine.

## 2024-01-06 DIAGNOSIS — H353131 Nonexudative age-related macular degeneration, bilateral, early dry stage: Secondary | ICD-10-CM | POA: Diagnosis not present

## 2024-01-06 DIAGNOSIS — H35371 Puckering of macula, right eye: Secondary | ICD-10-CM | POA: Diagnosis not present

## 2024-01-06 DIAGNOSIS — H4321 Crystalline deposits in vitreous body, right eye: Secondary | ICD-10-CM | POA: Diagnosis not present

## 2024-02-24 ENCOUNTER — Other Ambulatory Visit: Payer: Self-pay | Admitting: Family Medicine

## 2024-02-24 DIAGNOSIS — I10 Essential (primary) hypertension: Secondary | ICD-10-CM

## 2024-03-23 ENCOUNTER — Encounter: Payer: Self-pay | Admitting: Family Medicine

## 2024-03-23 ENCOUNTER — Ambulatory Visit: Payer: Medicare PPO | Admitting: Family Medicine

## 2024-03-23 VITALS — BP 114/75 | HR 70 | Temp 98.2°F | Ht 60.0 in | Wt 140.6 lb

## 2024-03-23 DIAGNOSIS — I10 Essential (primary) hypertension: Secondary | ICD-10-CM | POA: Diagnosis not present

## 2024-03-23 DIAGNOSIS — R7303 Prediabetes: Secondary | ICD-10-CM

## 2024-03-23 DIAGNOSIS — Z780 Asymptomatic menopausal state: Secondary | ICD-10-CM | POA: Diagnosis not present

## 2024-03-23 DIAGNOSIS — M8580 Other specified disorders of bone density and structure, unspecified site: Secondary | ICD-10-CM | POA: Diagnosis not present

## 2024-03-23 DIAGNOSIS — M791 Myalgia, unspecified site: Secondary | ICD-10-CM

## 2024-03-23 LAB — POCT GLYCOSYLATED HEMOGLOBIN (HGB A1C): Hemoglobin A1C: 6 % — AB (ref 4.0–5.6)

## 2024-03-23 MED ORDER — DICLOFENAC SODIUM 1 % EX GEL: 2.0000 g | Freq: Four times a day (QID) | CUTANEOUS | 5 refills | Status: AC | PRN

## 2024-03-23 MED ORDER — AMLODIPINE BESYLATE 5 MG PO TABS
5.0000 mg | ORAL_TABLET | Freq: Every day | ORAL | 3 refills | Status: AC
Start: 2024-03-23 — End: ?

## 2024-03-23 NOTE — Patient Instructions (Addendum)
 Please return in 6 months for your annual complete physical; please come fasting.    If you have any questions or concerns, please don't hesitate to send me a message via MyChart or call the office at [phone]. Thank you for visiting with us  today! It's our pleasure caring for you.   Please call the office checked below to schedule your appointment for your mammogram and/or bone density screen (the checked studies were ordered): [x]   Mammogram  [x]   Bone Density  [x]   [facility]     [address] [place], [place]        [phone]          VISIT SUMMARY: Today, you had a follow-up visit to review your prediabetes, hypertension, and other health concerns. Your hemoglobin A1c has improved, and your blood pressure remains well-controlled. We also discussed your chronic low back pain and general wellness.  YOUR PLAN: -LOW BACK PAIN DUE TO SPINAL STENOSIS: Spinal stenosis is a condition where the spaces within your spine narrow, which can put pressure on the nerves that travel through the spine. For your chronic low back pain, we will continue with your current topical gel and recommend taking Tylenol Extra Strength once or twice a day for pain management.  -PREDIABETES: Prediabetes means your blood sugar levels are higher than normal but not high enough to be classified as diabetes. Your hemoglobin A1c has improved from 6.4% to 6.0%. Continue with your dietary modifications to reduce sweets and limit rice intake.  -HYPERTENSION: Hypertension, or high blood pressure, is a condition in which the force of the blood against your artery walls is too high. Your blood pressure is well-controlled with your current medications. Continue taking amlodipine  and losartan  as prescribed.  -WELLNESS VISIT: This was a routine wellness visit. Your blood pressure is well-controlled. You are due for a bone density test this year, as your last one was in 2022. Continue with your  current diet, reducing sweets and limiting rice intake.  INSTRUCTIONS: Please schedule a bone density test and a mammogram for August.

## 2024-03-23 NOTE — Progress Notes (Signed)
 Subjective  CC:  Chief Complaint  Patient presents with   Hypertension   Prediabetes   Hyperlipidemia    HPI: Kelli Mcintyre is a 73 y.o. female who presents to the office today to address the problems listed above in the chief complaint. Hypertension f/u:  Discussed the use of AI scribe software for clinical note transcription with the patient, who gave verbal consent to proceed. An interpreter was used for interview and visit.  History of Present Illness Kelli Mcintyre is a 73 year old female with prediabetes and hypertension who presents for a follow-up visit.  Her hemoglobin A1c has decreased from 6.4% in November to 6.0% currently, remaining in the prediabetes range. Her diet has not changed since November, and she continues to consume a small amount of sweets and sugars. Typical [place] diet. No sxs of hyperglycemia.  Lab Results  Component Value Date   HGBA1C 6.0 (A) 03/23/2024   HGBA1C 6.4 09/23/2023   HGBA1C 6.1 (A) 03/17/2023      She experiences constipation, which she associates with her calcium  and vitamin D intake.  She has lumbar arthritis and spinal stenosis in her back and uses a 1% strength voltaren  gel for pain management which she finds helpful. Needs refill. She does not take Tylenol for her pain and reports no pain in her legs.  Her blood pressure is well-controlled with the use of amlodipine  and losartan . No cp or sob or edema.    Assessment  1. Prediabetes   2. Essential hypertension   3. Myalgia due to statin   4. Osteopenia, unspecified location   5. Asymptomatic menopausal state      Plan  Assessment and Plan Assessment & Plan Low back pain due to spinal stenosis and djd Chronic low back pain due to spinal stenosis without leg pain. - Refill current topical gel. - Recommend Tylenol Extra Strength once or twice a day for pain management.  Prediabetes Prediabetes with improved HbA1c from 6.4% to 6.0%, still above normal but improved. - Continue  dietary modifications to reduce sweets and limit rice intake.  Hypertension Hypertension is well-controlled with current medication regimen. - Continue amlodipine . Refilled 10 - Continue losartan . 100  Osteopenia: due for recheck. Last 2022.  - Schedule bone density test and mammogram for August.   Education regarding management of these chronic disease states was given. Management strategies discussed on successive visits include dietary and exercise recommendations, goals of achieving and maintaining IBW, and lifestyle modifications aiming for adequate sleep and minimizing stressors.  Follow up: 6 mo for cpe and f/u prediabetes and bp  Orders Placed This Encounter  Procedures   DG Bone Density   POCT glycosylated hemoglobin (Hb A1C)   Meds ordered this encounter  Medications   amLODipine  (NORVASC ) 5 MG tablet    Sig: Take 1 tablet (5 mg total) by mouth daily.    Dispense:  90 tablet    Refill:  3   diclofenac  Sodium (VOLTAREN ) 1 % GEL    Sig: Apply 2 g topically 4 (four) times daily as needed.    Dispense:  350 g    Refill:  5      BP Readings from Last 3 Encounters:  03/23/24 114/75  09/23/23 126/72  06/29/23 110/70   Wt Readings from Last 3 Encounters:  03/23/24 140 lb 9.6 oz (63.8 kg)  09/23/23 137 lb 9.6 oz (62.4 kg)  06/29/23 141 lb (64 kg)    Lab Results  Component Value Date  CHOL 198 09/23/2023   CHOL 173 09/18/2022   CHOL 180 08/19/2021   Lab Results  Component Value Date   HDL 57.30 09/23/2023   HDL 53.70 09/18/2022   HDL 56.10 08/19/2021   Lab Results  Component Value Date   LDLCALC 110 (H) 09/23/2023   LDLCALC 92 09/18/2022   LDLCALC 114 (H) 04/25/2020   Lab Results  Component Value Date   TRIG 155.0 (H) 09/23/2023   TRIG 136.0 09/18/2022   TRIG 264.0 (H) 08/19/2021   Lab Results  Component Value Date   CHOLHDL 3 09/23/2023   CHOLHDL 3 09/18/2022   CHOLHDL 3 08/19/2021   Lab Results  Component Value Date   LDLDIRECT 101.0  08/19/2021   LDLDIRECT 96.0 01/28/2021   Lab Results  Component Value Date   CREATININE 1.08 09/23/2023   BUN 21 09/23/2023   NA 138 09/23/2023   K 4.2 09/23/2023   CL 103 09/23/2023   CO2 28 09/23/2023    The 10-year ASCVD risk score (Arnett DK, et al., 2019) is: 15.4%   Values used to calculate the score:     Age: 39 years     Sex: Female     Is Non-Hispanic African American: No     Diabetic: No     Tobacco smoker: No     Systolic Blood Pressure: 114 mmHg     Is BP treated: Yes     HDL Cholesterol: 57.3 mg/dL     Total Cholesterol: 198 mg/dL  I reviewed the patients updated PMH, FH, and SocHx.    Patient Active Problem List   Diagnosis Date Noted   Mixed hyperlipidemia 12/07/2018    Priority: High   Prediabetes 03/23/2018    Priority: High   Essential hypertension 06/15/2017    Priority: High   Lumbar spinal stenosis 01/27/2020    Priority: Medium    Osteopenia 03/23/2018    Priority: Medium    DJD (degenerative joint disease), lumbar 06/15/2017    Priority: Medium    Overactive bladder 08/19/2021    Priority: Low   Myalgia due to statin 06/28/2019    Priority: Low   Primary osteoarthritis of both hands 09/16/2022   Asteroid hyalosis of right eye 09/11/2021   Pseudophakia of both eyes 09/11/2021   Epiretinal membrane, right eye 09/11/2021    Allergies: Lipitor [atorvastatin  calcium ]  Social History: Patient  reports that she has never smoked. She has never used smokeless tobacco. She reports that she does not drink alcohol and does not use drugs.  Current Meds  Medication Sig   Ascorbic Acid (VITAMIN C) 1000 MG tablet Take 1,000 mg by mouth daily.   aspirin 81 MG chewable tablet 1 tablet   Cholecalciferol (VITAMIN D3) 50 MCG (2000 UT) TABS Take 50 mcg by mouth daily.   Cyanocobalamin (VITAMIN B12) 3000 MCG SUBL Take 3,000 mcg by mouth daily.   ezetimibe  (ZETIA ) 10 MG tablet TAKE 1 TABLET BY MOUTH EVERYDAY AT BEDTIME   losartan  (COZAAR ) 100 MG tablet  TAKE 1 TABLET BY MOUTH EVERY DAY   Magnesium 250 MG TABS Take 250 mg by mouth daily.   Multiple Vitamin (MULTIVITAMIN) tablet Take 1 tablet by mouth daily.   Zinc 30 MG TABS Take 30 mg by mouth daily.   [DISCONTINUED] amLODipine  (NORVASC ) 5 MG tablet Take 1 tablet (5 mg total) by mouth daily.   [DISCONTINUED] diclofenac  Sodium (VOLTAREN ) 1 % GEL Apply 2 g topically 4 (four) times daily as needed (hand pain).  Review of Systems: Cardiovascular: negative for chest pain, palpitations, leg swelling, orthopnea Respiratory: negative for SOB, wheezing or persistent cough Gastrointestinal: negative for abdominal pain Genitourinary: negative for dysuria or gross hematuria  Objective  Vitals: BP 114/75   Pulse 70   Temp 98.2 F (36.8 C)   Ht 5' (1.524 m)   Wt 140 lb 9.6 oz (63.8 kg)   SpO2 96%   BMI 27.46 kg/m  General: no acute distress  Psych:  Alert and oriented, normal mood and affect HEENT:  Normocephalic, atraumatic, supple neck  Cardiovascular:  RRR without murmur. no edema Respiratory:  Good breath sounds bilaterally, CTAB with normal respiratory effort Neurologic:   Mental status is normal Commons side effects, risks, benefits, and alternatives for medications and treatment plan prescribed today were discussed, and the patient expressed understanding of the given instructions. Patient is instructed to call or message via MyChart if he/she has any questions or concerns regarding our treatment plan. No barriers to understanding were identified. We discussed Red Flag symptoms and signs in detail. Patient expressed understanding regarding what to do in case of urgent or emergency type symptoms.  Medication list was reconciled, printed and provided to the patient in AVS. Patient instructions and summary information was reviewed with the patient as documented in the AVS. This note was prepared with assistance of Dragon voice recognition software. Occasional wrong-word or sound-a-like  substitutions may have occurred due to the inherent limitation

## 2024-03-31 IMAGING — MG MM DIGITAL SCREENING BILAT W/ TOMO AND CAD
6 of 10 series · 6 of 30 positions shown · non-contrast
Comparison: Previous exam(s).

CLINICAL DATA: Screening.

EXAM:
DIGITAL SCREENING BILATERAL MAMMOGRAM WITH TOMOSYNTHESIS AND CAD
TECHNIQUE: Bilateral screening digital craniocaudal and mediolateral oblique
mammograms were obtained. Bilateral screening digital breast
tomosynthesis was performed. The images were evaluated with
computer-aided detection.

[R MLO synth-2D]
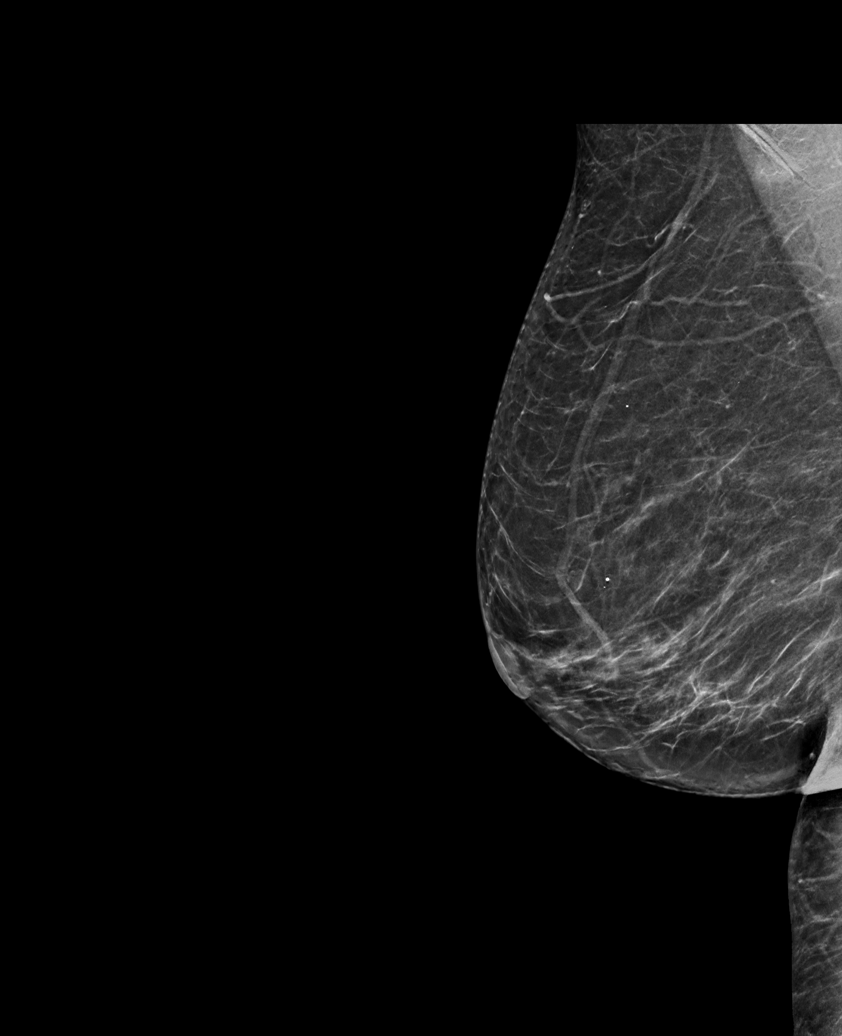

[R CC synth-2D]
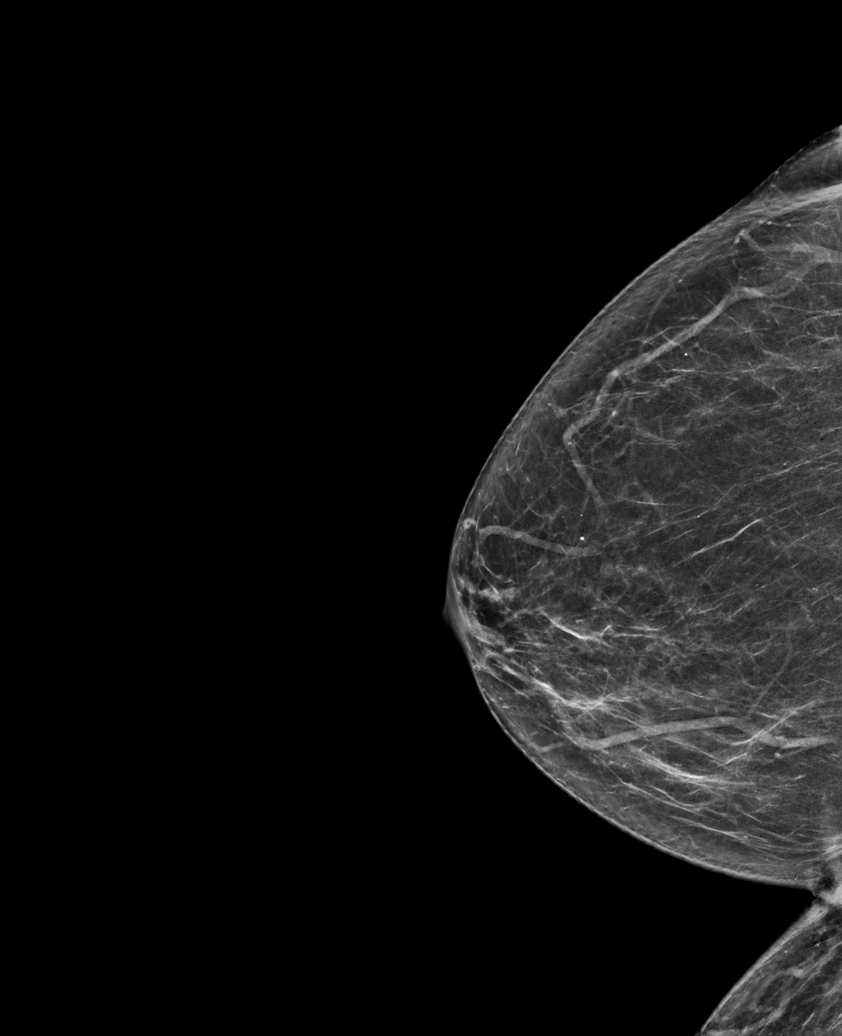

[L MLO synth-2D (1 of 2)]
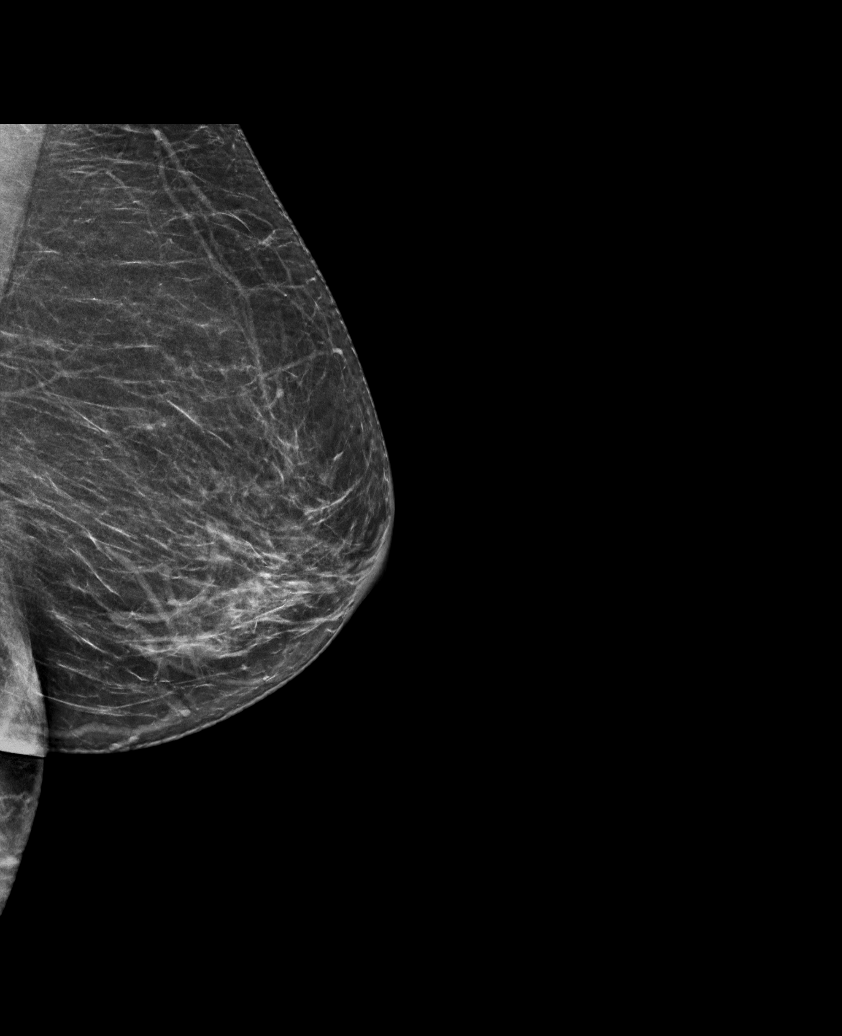

[L CC synth-2D]
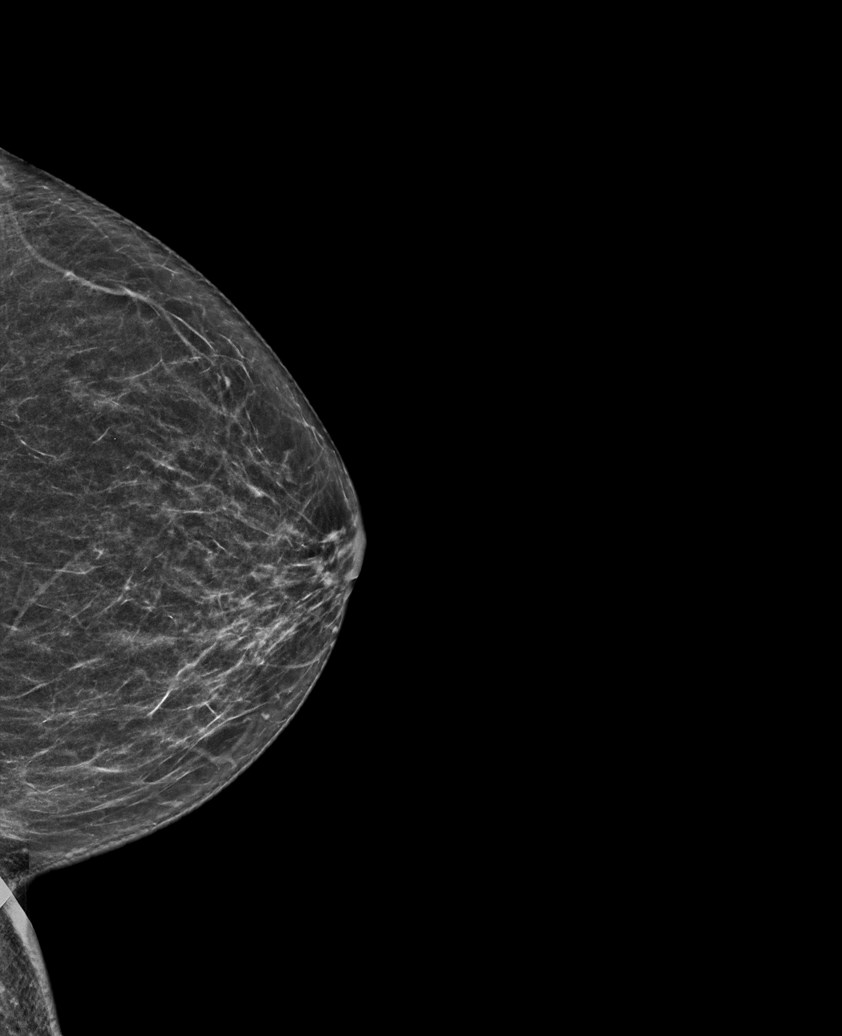

[L MLO synth-2D (2 of 2)]
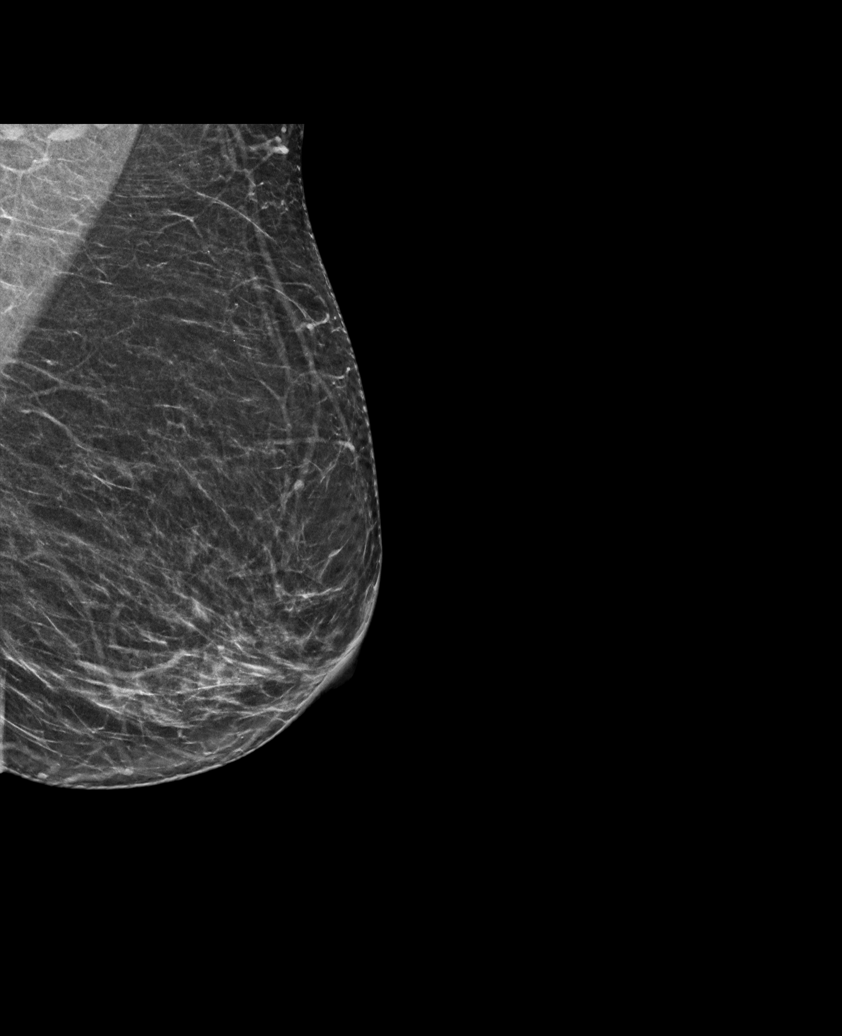

[R CC tomo · tomo slice 35/68.0]
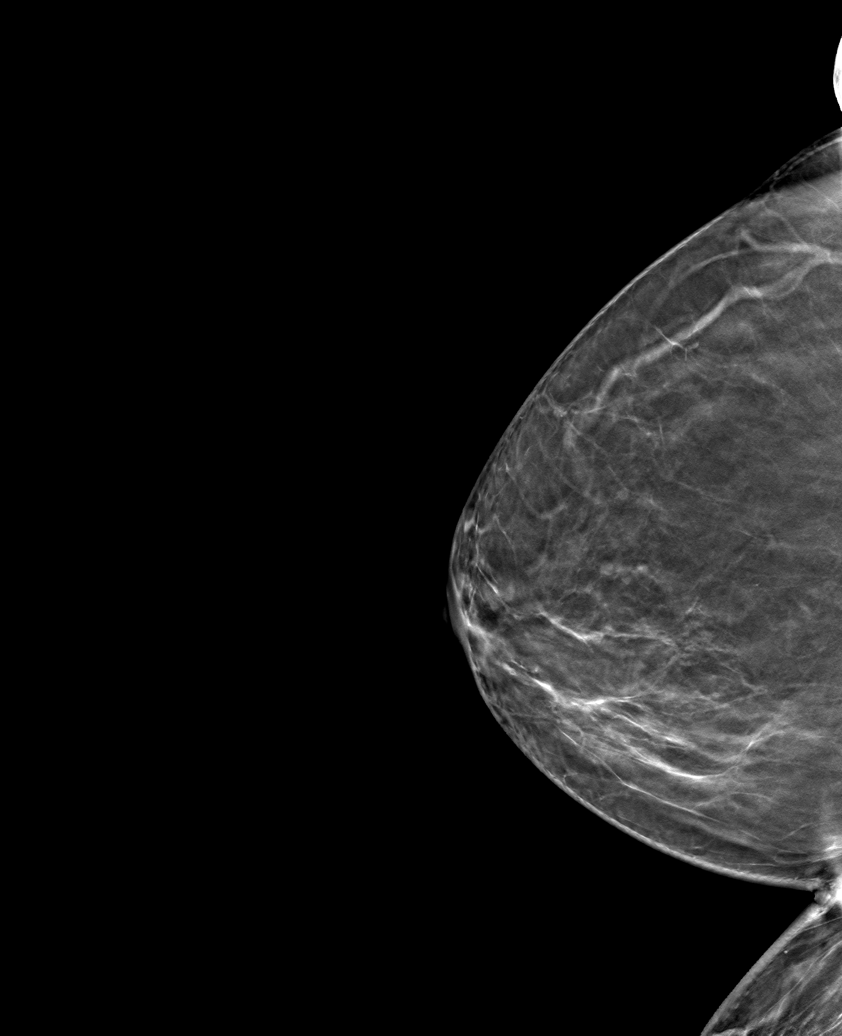

[6 of 30 positions shown; findings below may reference images not displayed]

ACR Breast Density Category b: There are scattered areas of
fibroglandular density.
FINDINGS: There are no findings suspicious for malignancy.
IMPRESSION: No mammographic evidence of malignancy. A result letter of this
screening mammogram will be mailed directly to the patient.

RECOMMENDATION:
Screening mammogram in one year. (Code:51-O-LD2)

BI-RADS CATEGORY  1: Negative.

## 2024-04-28 ENCOUNTER — Encounter: Payer: Self-pay | Admitting: Pharmacist

## 2024-04-28 NOTE — Progress Notes (Addendum)
 Pharmacy Quality Measure Review  This patient is appearing on a report for being at risk of failing the adherence measure for hypertension (ACEi/ARB) medications this calendar year.   Medication: losartan   Last fill date: 11/26/2023 for 90 day supply  Patient should have Rx available at pharmacy that was sent in 02/24/2024 but has not been filled yet.    Verified with [facility] that they do have Rx on file for losartan   Tried to contact patient with [place] interpreter. Unavailable on home and cell number. Intrepreter Left voicemail for patient to return my call at their convenience.   I also sent MyChart message about losartan  refill.   Cecilie Coffee, PharmD Clinical Pharmacist [facility] [phone]   04/29/2024 - Patient responded to MyChart message that she had a stockpile of losartan  but she had  been taking daily. She will request a refill soon as she is starting to use up her stockpile of losartan .   Cecilie Coffee, PharmD Clinical Pharmacist [facility]  Population [facility] [phone]

## 2024-04-29 ENCOUNTER — Other Ambulatory Visit: Payer: Self-pay | Admitting: Family Medicine

## 2024-04-29 DIAGNOSIS — Z1231 Encounter for screening mammogram for malignant neoplasm of breast: Secondary | ICD-10-CM

## 2024-05-04 ENCOUNTER — Ambulatory Visit (INDEPENDENT_AMBULATORY_CARE_PROVIDER_SITE_OTHER)

## 2024-05-04 DIAGNOSIS — M8580 Other specified disorders of bone density and structure, unspecified site: Secondary | ICD-10-CM

## 2024-05-04 DIAGNOSIS — Z780 Asymptomatic menopausal state: Secondary | ICD-10-CM | POA: Diagnosis not present

## 2024-05-04 DIAGNOSIS — Z13820 Encounter for screening for osteoporosis: Secondary | ICD-10-CM

## 2024-05-05 ENCOUNTER — Ambulatory Visit: Payer: Self-pay | Admitting: Family Medicine

## 2024-05-05 NOTE — Progress Notes (Signed)
 See mychart note Dexa 05/2021: lowest T = -1.1; recheck 2-3 years DEXA 04/2024: lowest T = -0.8; normal category. Recheck 3 years.  Dear [name], Your bone density looks good now. We can recheck in 3 years.  Sincerely, Dr. Jonelle Neri

## 2024-06-13 ENCOUNTER — Encounter: Payer: Self-pay | Admitting: Internal Medicine

## 2024-06-13 ENCOUNTER — Ambulatory Visit (INDEPENDENT_AMBULATORY_CARE_PROVIDER_SITE_OTHER): Admitting: Internal Medicine

## 2024-06-13 VITALS — BP 120/74 | HR 62 | Temp 98.0°F | Ht 60.0 in | Wt 140.6 lb

## 2024-06-13 DIAGNOSIS — J020 Streptococcal pharyngitis: Secondary | ICD-10-CM | POA: Insufficient documentation

## 2024-06-13 MED ORDER — AMOXICILLIN-POT CLAVULANATE 875-125 MG PO TABS
1.0000 | ORAL_TABLET | Freq: Two times a day (BID) | ORAL | 0 refills | Status: DC
Start: 2024-06-13 — End: 2024-09-28

## 2024-06-13 NOTE — Assessment & Plan Note (Signed)
 Inadequately treated. She experienced a sore throat for about a week, mainly on the right side, with odynophagia and no fevers. Symptoms have completely resolved in the last few days after taking an antibiotic(s) for briefly. She used ibuprofen, acetaminophen, and warm salt water gargles for relief. A streptococcal infection was suspected, with a potential risk of seeding onto heart valves if untreated. Prescribe amoxicillin  clavulanic acid (Augmentin ) to be taken if symptoms return or concerns about heart valve infection arise. Advise completing the full course of antibiotics if started to protect against potential heart valve infection. Instruct her to pick up the prescription from [facility]. Advise returning if symptoms recur or if there are any issues with the medication.

## 2024-06-13 NOTE — Patient Instructions (Addendum)
 It was a pleasure seeing you today! Your health and satisfaction are our top priorities.  Kelli Cone, MD  Your Providers PCP: Jodie Lavern CROME, MD,  [phone]) Referring Provider: Jodie Lavern CROME, MD,  [phone])     NEXT STEPS: [x]  Early Intervention: Schedule sooner appointment, call our on-call services, or go to emergency room if there is any significant Increase in pain or discomfort New or worsening symptoms Sudden or severe changes in your health [x]  Flexible Follow-Up: We recommend a No follow-ups on file. for optimal routine care. This allows for progress monitoring and treatment adjustments. [x]  Preventive Care: Schedule your annual preventive care visit! It's typically covered by insurance and helps identify potential health issues early. [x]  Lab & X-ray Appointments: Incomplete tests scheduled today, or call to schedule. X-rays: [facility] (M-F, 8:30am-noon or 1pm-5pm). [x]  Medical Information Release: Sign a release form at front desk to obtain relevant medical information we don't have.  MAKING THE MOST OF OUR FOCUSED 20 MINUTE APPOINTMENTS: [x]   Clearly state your top concerns at the beginning of the visit to focus our discussion [x]   If you anticipate you will need more time, please inform the front desk during scheduling - we can book multiple appointments in the same week. [x]   If you have transportation problems- use our convenient video appointments or ask about transportation support. [x]   We can get down to business faster if you use MyChart to update information before the visit and submit non-urgent questions before your visit. Thank you for taking the time to provide details through MyChart.  Let our nurse know and she can import this information into your encounter documents.  Arrival and Wait Times: [x]   Arriving on time ensures that everyone receives prompt attention. [x]   Early morning (8a) and afternoon (1p) appointments tend to  have shortest wait times. [x]   Unfortunately, we cannot delay appointments for late arrivals or hold slots during phone calls.  Getting Answers and Following Up [x]   Simple Questions & Concerns: For quick questions or basic follow-up after your visit, reach us  at [phone] or MyChart messaging. [x]   Complex Concerns: If your concern is more complex, scheduling an appointment might be best. Discuss this with the staff to find the most suitable option. [x]   Lab & Imaging Results: We'll contact you directly if results are abnormal or you don't use MyChart. Most normal results will be on MyChart within 2-3 business days, with a review message from Dr. Cone. Haven't heard back in 2 weeks? Need results sooner? Contact us  at [phone]. [x]   Referrals: Our referral coordinator will manage specialist referrals. The specialist's office should contact you within 2 weeks to schedule an appointment. Call us  if you haven't heard from them after 2 weeks.  Staying Connected [x]   MyChart: Activate your MyChart for the fastest way to access results and message us . See the last page of this paperwork for instructions on how to activate.  Bring to Your Next Appointment [x]   Medications: Please bring all your medication bottles to your next appointment to ensure we have an accurate record of your prescriptions. [x]   Health Diaries: If you're monitoring any health conditions at home, keeping a diary of your readings can be very helpful for discussions at your next appointment.  Billing [x]   X-ray & Lab Orders: These are billed by separate companies. Contact the invoicing company directly for questions or concerns. [x]   Visit Charges: Discuss any billing inquiries with our administrative services  team.  Your Satisfaction Matters [x]   Share Your Experience: We strive for your satisfaction! If you have any complaints, or preferably compliments, please let Dr. Jesus know directly or contact our Practice  Administrators, Manuelita Rubin or [employer], by asking at the front desk.   Reviewing Your Records [x]   Review this early draft of your clinical encounter notes below and the final encounter summary tomorrow on MyChart after its been completed.  All orders placed so far are visible here: Strep pharyngitis -     Amoxicillin -Pot Clavulanate; Take 1 tablet by mouth 2 (two) times daily.  Dispense: 20 tablet; Refill: 0    VISIT SUMMARY:  Today, you came in because you had a sore throat that has now resolved. You had pain on the right side of your throat for about a week, especially when swallowing, but you did not have a fever. You managed your symptoms with Advil, Panadol, and warm salt water gargles, and now you are feeling better with no pain for the past few days.  YOUR PLAN:  -RESOLVED SORE THROAT: A sore throat can be caused by various infections, including bacterial ones like strep throat. You had pain on the right side of your throat for about a week, but it has now resolved. To be cautious, a prescription for amoxicillin  clavulanic acid (Augmentin ) has been provided. This is an antibiotic that you should take if your symptoms return or if there are concerns about a heart valve infection. If you start the antibiotics, make sure to complete the full course to protect against potential heart valve infection. You can pick up the prescription from [facility]. Please return if your symptoms come back or if you have any issues with the medication.  INSTRUCTIONS:  Please pick up your prescription for amoxicillin  clavulanic acid (Augmentin ) from [facility]. Take the antibiotics if your sore throat returns or if there are concerns about a heart valve infection, and make sure to complete the full course. Return to the clinic if your symptoms recur or if you have any issues with the medication.

## 2024-06-13 NOTE — Progress Notes (Signed)
 ==============================  [facility]: [phone]   -- Medical Office Visit --  Patient: Kelli Mcintyre      Age: 73 y.o.       Sex:  female  Date:   06/13/2024 Today's Healthcare Provider: Bernardino KANDICE Cone, MD  ==============================   Chief Complaint: Sore Throat (Pt states serve sore throat but states she is feeling better now. No fevers or anything else going on at this time.states has been going on for week now.)   Discussed the use of AI scribe software for clinical note transcription with the patient, who gave verbal consent to proceed.  History of Present Illness  73 year old female who presents with a resolved sore throat.  She experienced a sore throat for approximately one week, primarily on the right side, with pain during swallowing. There was no fever during this time. She managed her symptoms with Advil and Panadol, taking one of each, and also gargled warm salt water three times a day. These treatments were effective, and her symptoms have resolved completely, with no pain for the past couple of days.  She did not experience any choking or difficulty swallowing food. She did feel a tightness and pain when swallowing, but this has since resolved.  She has previously taken antibiotics for similar symptoms, which were prescribed by a specialist after a camera examination.   Background Reviewed: Problem List: has Essential hypertension; DJD (degenerative joint disease), lumbar; Prediabetes; Osteopenia; Mixed hyperlipidemia; Myalgia due to statin; Lumbar spinal stenosis; Overactive bladder; Asteroid hyalosis of right eye; Pseudophakia of both eyes; Epiretinal membrane, right eye; Primary osteoarthritis of both hands; and Strep pharyngitis on their problem list. Past Medical History:  has a past medical history of Hyperlipemia, Hypertension, Lumbar spinal stenosis (01/27/2020), Overactive bladder (08/19/2021), and Urinary  incontinence. Past Surgical History:   has a past surgical history that includes Cataract extraction (N/A) and Tubal ligation. Social History:   reports that she has never smoked. She has never used smokeless tobacco. She reports that she does not drink alcohol and does not use drugs. Family History:  family history includes Diabetes in her mother; Hypertension in her mother. Allergies:  is allergic to lipitor [atorvastatin  calcium ].   Medication Reconciliation: Current Outpatient Medications on File Prior to Visit  Medication Sig   amLODipine  (NORVASC ) 5 MG tablet Take 1 tablet (5 mg total) by mouth daily.   Ascorbic Acid (VITAMIN C) 1000 MG tablet Take 1,000 mg by mouth daily.   aspirin 81 MG chewable tablet 1 tablet   Cholecalciferol (VITAMIN D3) 50 MCG (2000 UT) TABS Take 50 mcg by mouth daily.   Cyanocobalamin (VITAMIN B12) 3000 MCG SUBL Take 3,000 mcg by mouth daily.   diclofenac  Sodium (VOLTAREN ) 1 % GEL Apply 2 g topically 4 (four) times daily as needed.   ezetimibe  (ZETIA ) 10 MG tablet TAKE 1 TABLET BY MOUTH EVERYDAY AT BEDTIME   losartan  (COZAAR ) 100 MG tablet TAKE 1 TABLET BY MOUTH EVERY DAY   Magnesium 250 MG TABS Take 250 mg by mouth daily.   Multiple Vitamin (MULTIVITAMIN) tablet Take 1 tablet by mouth daily.   Zinc 30 MG TABS Take 30 mg by mouth daily.   No current facility-administered medications on file prior to visit.  There are no discontinued medications.   Physical Exam:    06/13/2024    3:39 PM 03/23/2024    7:59 AM 09/23/2023    8:20 AM  Vitals with BMI  Height 5'  0 5' 0 5' 0  Weight 140 lbs 10 oz 140 lbs 10 oz 137 lbs 10 oz  BMI 27.46 27.46 26.87  Systolic 120 114 873  Diastolic 74 75 72  Pulse 62 70 75  Vital signs reviewed.  Nursing notes reviewed. Weight trend reviewed. Physical Exam General Appearance:  No acute distress appreciable.   Well-groomed, healthy-appearing female.  Well proportioned with no abnormal fat distribution.  Good muscle  tone. Pulmonary:  Normal work of breathing at rest, no respiratory distress apparent. SpO2: 98 %  Musculoskeletal: All extremities are intact.  Neurological:  Awake, alert, oriented, and engaged.  No obvious focal neurological deficits or cognitive impairments.  Sensorium seems unclouded.   Speech is clear and coherent with logical content. Psychiatric:  Appropriate mood, pleasant and cooperative demeanor, thoughtful and engaged during the exam Physical Exam HEENT: Throat not visible due to low throat anatomy. NECK: No cervical lymphadenopathy. CARDIOVASCULAR: Normal heart sounds. Cannot see tonsils. No cervical lymphadenopathy palpable, s1 and s2 heart sounds have regular rate and rhythm no murmurs, rubs, or gallops     No results found for any visits on 06/13/24. Office Visit on 03/23/2024  Component Date Value Ref Range Status   Hemoglobin A1C 03/23/2024 6.0 (A)  4.0 - 5.6 % Final  Office Visit on 09/23/2023  Component Date Value Ref Range Status   WBC 09/23/2023 8.0  4.0 - 10.5 K/uL Final   RBC 09/23/2023 4.74  3.87 - 5.11 Mil/uL Final   Hemoglobin 09/23/2023 13.7  12.0 - 15.0 g/dL Final   HCT 88/93/7975 42.5  36.0 - 46.0 % Final   MCV 09/23/2023 89.6  78.0 - 100.0 fl Final   MCHC 09/23/2023 32.2  30.0 - 36.0 g/dL Final   RDW 88/93/7975 13.7  11.5 - 15.5 % Final   Platelets 09/23/2023 251.0  150.0 - 400.0 K/uL Final   Neutrophils Relative % 09/23/2023 61.4  43.0 - 77.0 % Final   Lymphocytes Relative 09/23/2023 29.8  12.0 - 46.0 % Final   Monocytes Relative 09/23/2023 6.6  3.0 - 12.0 % Final   Eosinophils Relative 09/23/2023 1.7  0.0 - 5.0 % Final   Basophils Relative 09/23/2023 0.5  0.0 - 3.0 % Final   Neutro Abs 09/23/2023 4.9  1.4 - 7.7 K/uL Final   Lymphs Abs 09/23/2023 2.4  0.7 - 4.0 K/uL Final   Monocytes Absolute 09/23/2023 0.5  0.1 - 1.0 K/uL Final   Eosinophils Absolute 09/23/2023 0.1  0.0 - 0.7 K/uL Final   Basophils Absolute 09/23/2023 0.0  0.0 - 0.1 K/uL Final    Sodium 09/23/2023 138  135 - 145 mEq/L Final   Potassium 09/23/2023 4.2  3.5 - 5.1 mEq/L Final   Chloride 09/23/2023 103  96 - 112 mEq/L Final   CO2 09/23/2023 28  19 - 32 mEq/L Final   Glucose, Bld 09/23/2023 95  70 - 99 mg/dL Final   BUN 88/93/7975 21  6 - 23 mg/dL Final   Creatinine, Ser 09/23/2023 1.08  0.40 - 1.20 mg/dL Final   Total Bilirubin 09/23/2023 0.6  0.2 - 1.2 mg/dL Final   Alkaline Phosphatase 09/23/2023 48  39 - 117 U/L Final   AST 09/23/2023 22  0 - 37 U/L Final   ALT 09/23/2023 17  0 - 35 U/L Final   Total Protein 09/23/2023 7.7  6.0 - 8.3 g/dL Final   Albumin 88/93/7975 4.3  3.5 - 5.2 g/dL Final   GFR 88/93/7975 50.96 (L)  >60.00 mL/min Final  Calcium  09/23/2023 9.7  8.4 - 10.5 mg/dL Final   Cholesterol 88/93/7975 198  0 - 200 mg/dL Final   Triglycerides 88/93/7975 155.0 (H)  0.0 - 149.0 mg/dL Final   HDL 88/93/7975 57.30  >39.00 mg/dL Final   VLDL 88/93/7975 31.0  0.0 - 40.0 mg/dL Final   LDL Cholesterol 09/23/2023 110 (H)  0 - 99 mg/dL Final   Total CHOL/HDL Ratio 09/23/2023 3   Final   NonHDL 09/23/2023 141.19   Final   Hgb A1c MFr Bld 09/23/2023 6.4  4.6 - 6.5 % Final   TSH 09/23/2023 4.45  0.35 - 5.50 uIU/mL Final  Office Visit on 03/17/2023  Component Date Value Ref Range Status   Hemoglobin A1C 03/17/2023 6.1 (A)  4.0 - 5.6 % Final  Admission on 03/04/2023, Discharged on 03/04/2023  Component Date Value Ref Range Status   SARS Coronavirus 2 by RT PCR 03/04/2023 NEGATIVE  NEGATIVE Final  Lab on 09/18/2022  Component Date Value Ref Range Status   TSH 09/18/2022 2.57  0.35 - 5.50 uIU/mL Final   Cholesterol 09/18/2022 173  0 - 200 mg/dL Final   Triglycerides 88/97/7976 136.0  0.0 - 149.0 mg/dL Final   HDL 88/97/7976 53.70  >39.00 mg/dL Final   VLDL 88/97/7976 27.2  0.0 - 40.0 mg/dL Final   LDL Cholesterol 09/18/2022 92  0 - 99 mg/dL Final   Total CHOL/HDL Ratio 09/18/2022 3   Final   NonHDL 09/18/2022 118.97   Final   Sodium 09/18/2022 140  135 - 145  mEq/L Final   Potassium 09/18/2022 4.2  3.5 - 5.1 mEq/L Final   Chloride 09/18/2022 105  96 - 112 mEq/L Final   CO2 09/18/2022 27  19 - 32 mEq/L Final   Glucose, Bld 09/18/2022 92  70 - 99 mg/dL Final   BUN 88/97/7976 20  6 - 23 mg/dL Final   Creatinine, Ser 09/18/2022 0.99  0.40 - 1.20 mg/dL Final   Total Bilirubin 09/18/2022 0.6  0.2 - 1.2 mg/dL Final   Alkaline Phosphatase 09/18/2022 41  39 - 117 U/L Final   AST 09/18/2022 21  0 - 37 U/L Final   ALT 09/18/2022 17  0 - 35 U/L Final   Total Protein 09/18/2022 7.5  6.0 - 8.3 g/dL Final   Albumin 88/97/7976 4.2  3.5 - 5.2 g/dL Final   GFR 88/97/7976 56.97 (L)  >60.00 mL/min Final   Calcium  09/18/2022 9.4  8.4 - 10.5 mg/dL Final   WBC 88/97/7976 8.2  4.0 - 10.5 K/uL Final   RBC 09/18/2022 4.64  3.87 - 5.11 Mil/uL Final   Hemoglobin 09/18/2022 13.5  12.0 - 15.0 g/dL Final   HCT 88/97/7976 40.9  36.0 - 46.0 % Final   MCV 09/18/2022 88.1  78.0 - 100.0 fl Final   MCHC 09/18/2022 33.0  30.0 - 36.0 g/dL Final   RDW 88/97/7976 14.5  11.5 - 15.5 % Final   Platelets 09/18/2022 219.0  150.0 - 400.0 K/uL Final   Neutrophils Relative % 09/18/2022 59.1  43.0 - 77.0 % Final   Lymphocytes Relative 09/18/2022 29.9  12.0 - 46.0 % Final   Monocytes Relative 09/18/2022 7.0  3.0 - 12.0 % Final   Eosinophils Relative 09/18/2022 3.3  0.0 - 5.0 % Final   Basophils Relative 09/18/2022 0.7  0.0 - 3.0 % Final   Neutro Abs 09/18/2022 4.8  1.4 - 7.7 K/uL Final   Lymphs Abs 09/18/2022 2.4  0.7 - 4.0 K/uL Final   Monocytes  Absolute 09/18/2022 0.6  0.1 - 1.0 K/uL Final   Eosinophils Absolute 09/18/2022 0.3  0.0 - 0.7 K/uL Final   Basophils Absolute 09/18/2022 0.1  0.0 - 0.1 K/uL Final  Office Visit on 09/16/2022  Component Date Value Ref Range Status   Hemoglobin A1C 09/16/2022 5.8 (A)  4.0 - 5.6 % Final  No image results found. DG Bone Density Result Date: 05/04/2024 EXAM: DUAL X-RAY ABSORPTIOMETRY (DXA) FOR BONE MINERAL DENSITY 05/04/2024 1:37 pm CLINICAL  DATA:  73 year old Female Postmenopausal. Screening for osteoporosis TECHNIQUE: An axial (e.g., hips, spine) and/or appendicular (e.g., radius) exam was performed, as appropriate, using GE Secretary/administrator at [employer]. Images are obtained for bone mineral density measurement and are not obtained for diagnostic purposes. [id number] Exclusions: L4 due to degenerative changes COMPARISON:  None. New baseline. FINDINGS: Scan quality: Good. LUMBAR SPINE (L1-L3): BMD (in g/cm2): 1.149 T-score: -0.2 Z-score: 1.6 LEFT FEMORAL NECK: BMD (in g/cm2): 0.973 T-score: -0.5 Z-score: 1.4 LEFT TOTAL HIP: BMD (in g/cm2): 0.965 T-score: -0.3 Z-score: 1.3 RIGHT FEMORAL NECK: BMD (in g/cm2): 0.924 T-score: -0.8 Z-score: 1.1 RIGHT TOTAL HIP: BMD (in g/cm2): 0.961 T-score: -0.4 Z-score: 1.3 FRAX 10-YEAR PROBABILITY OF FRACTURE: FRAX not reported as the lowest BMD is not in the osteopenia range. IMPRESSION: Normal based on BMD. Fracture risk is not increased. RECOMMENDATIONS: 1. All patients should optimize calcium  and vitamin D intake. 2. Consider FDA-approved medical therapies in postmenopausal women and men aged 17 years and older, based on the following: - A hip or vertebral (clinical or morphometric) fracture - T-score less than or equal to -2.5 and secondary causes have been excluded. - Low bone mass (T-score between -1.0 and -2.5) and a 10-year probability of a hip fracture greater than or equal to 3% or a 10-year probability of a major osteoporosis-related fracture greater than or equal to 20% based on the [place] -adapted WHO algorithm. - Clinician judgment and/or patient preferences may indicate treatment for people with 10-year fracture probabilities above or below these levels 3. Patients with diagnosis of osteoporosis or at high risk for fracture should have regular bone mineral density tests. For patients eligible for Medicare, routine testing is allowed once every 2 years. The testing frequency can be  increased to one year for patients who have rapidly progressing disease, those who are receiving or discontinuing medical therapy to restore bone mass, or have additional risk factors. Electronically Signed   By: Dina  Arceo M.D.   On: 05/04/2024 14:48         ASSESSMENT & PLAN   Assessment & Plan Strep pharyngitis Inadequately treated. She experienced a sore throat for about a week, mainly on the right side, with odynophagia and no fevers. Symptoms have completely resolved in the last few days after taking an antibiotic(s) for briefly. She used ibuprofen, acetaminophen, and warm salt water gargles for relief. A streptococcal infection was suspected, with a potential risk of seeding onto heart valves if untreated. Prescribe amoxicillin  clavulanic acid (Augmentin ) to be taken if symptoms return or concerns about heart valve infection arise. Advise completing the full course of antibiotics if started to protect against potential heart valve infection. Instruct her to pick up the prescription from [facility]. Advise returning if symptoms recur or if there are any issues with the medication.   ORDER ASSOCIATIONS  #   DIAGNOSIS / CONDITION ICD-10 ENCOUNTER ORDER     ICD-10-CM   1. Strep pharyngitis  J02.0 amoxicillin -clavulanate (AUGMENTIN ) 875-125 MG tablet  Meds ordered this encounter  Medications   amoxicillin -clavulanate (AUGMENTIN ) 875-125 MG tablet    Sig: Take 1 tablet by mouth 2 (two) times daily.    Dispense:  20 tablet    Refill:  0      This document was synthesized by artificial intelligence ([employer]) using HIPAA-compliant recording of the clinical interaction;   We discussed the use of AI scribe software for clinical note transcription with the patient, who gave verbal consent to proceed. additional Info: This encounter employed state-of-the-art, real-time, collaborative documentation. The patient actively reviewed and assisted in updating their electronic medical record  on a shared screen, ensuring transparency and facilitating joint problem-solving for the problem list, overview, and plan. This approach promotes accurate, informed care. The treatment plan was discussed and reviewed in detail, including medication safety, potential side effects, and all patient questions. We confirmed understanding and comfort with the plan. Follow-up instructions were established, including contacting the office for any concerns, returning if symptoms worsen, persist, or new symptoms develop, and precautions for potential emergency department visits.

## 2024-07-01 ENCOUNTER — Ambulatory Visit
Admission: RE | Admit: 2024-07-01 | Discharge: 2024-07-01 | Disposition: A | Discharge status: Home / Self Care | Source: Ambulatory Visit | Attending: Family Medicine | Admitting: Family Medicine

## 2024-07-01 DIAGNOSIS — Z1231 Encounter for screening mammogram for malignant neoplasm of breast: Secondary | ICD-10-CM

## 2024-07-04 ENCOUNTER — Ambulatory Visit: Payer: Medicare PPO

## 2024-07-04 DIAGNOSIS — H353131 Nonexudative age-related macular degeneration, bilateral, early dry stage: Secondary | ICD-10-CM | POA: Diagnosis not present

## 2024-07-04 DIAGNOSIS — H35371 Puckering of macula, right eye: Secondary | ICD-10-CM | POA: Diagnosis not present

## 2024-07-04 DIAGNOSIS — H1013 Acute atopic conjunctivitis, bilateral: Secondary | ICD-10-CM | POA: Diagnosis not present

## 2024-07-04 DIAGNOSIS — H4321 Crystalline deposits in vitreous body, right eye: Secondary | ICD-10-CM | POA: Diagnosis not present

## 2024-08-06 ENCOUNTER — Other Ambulatory Visit: Payer: Self-pay | Admitting: Family Medicine

## 2024-08-12 ENCOUNTER — Ambulatory Visit

## 2024-08-15 ENCOUNTER — Ambulatory Visit

## 2024-09-23 ENCOUNTER — Encounter: Payer: Medicare PPO | Admitting: Family Medicine

## 2024-09-28 ENCOUNTER — Encounter: Payer: Self-pay | Admitting: Family Medicine

## 2024-09-28 ENCOUNTER — Ambulatory Visit (INDEPENDENT_AMBULATORY_CARE_PROVIDER_SITE_OTHER): Admitting: Family Medicine

## 2024-09-28 VITALS — BP 147/89 | HR 74 | Temp 98.1°F | Ht 60.0 in | Wt 137.2 lb

## 2024-09-28 DIAGNOSIS — M791 Myalgia, unspecified site: Secondary | ICD-10-CM | POA: Diagnosis not present

## 2024-09-28 DIAGNOSIS — R7303 Prediabetes: Secondary | ICD-10-CM | POA: Diagnosis not present

## 2024-09-28 DIAGNOSIS — T466X5A Adverse effect of antihyperlipidemic and antiarteriosclerotic drugs, initial encounter: Secondary | ICD-10-CM

## 2024-09-28 DIAGNOSIS — M8580 Other specified disorders of bone density and structure, unspecified site: Secondary | ICD-10-CM

## 2024-09-28 DIAGNOSIS — I10 Essential (primary) hypertension: Secondary | ICD-10-CM

## 2024-09-28 DIAGNOSIS — E782 Mixed hyperlipidemia: Secondary | ICD-10-CM | POA: Diagnosis not present

## 2024-09-28 DIAGNOSIS — Z0000 Encounter for general adult medical examination without abnormal findings: Secondary | ICD-10-CM

## 2024-09-28 DIAGNOSIS — Z0001 Encounter for general adult medical examination with abnormal findings: Secondary | ICD-10-CM

## 2024-09-28 DIAGNOSIS — M48061 Spinal stenosis, lumbar region without neurogenic claudication: Secondary | ICD-10-CM

## 2024-09-28 DIAGNOSIS — M47816 Spondylosis without myelopathy or radiculopathy, lumbar region: Secondary | ICD-10-CM

## 2024-09-28 MED ORDER — LIDOCAINE 5 % EX PTCH: 1.0000 | MEDICATED_PATCH | Freq: Every day | CUTANEOUS | 3 refills | Status: AC | PRN

## 2024-09-28 NOTE — Patient Instructions (Signed)
 Please return in 6 months for blood pressure recheck  I will release your lab results to you on your MyChart account with further instructions. You may see the results before I do, but when I review them I will send you a message with my report or have my assistant call you if things need to be discussed. Please reply to my message with any questions. Thank you!   If you have any questions or concerns, please don't hesitate to send me a message via MyChart or call the office at [phone]. Thank you for visiting with us  today! It's our pleasure caring for you.

## 2024-09-28 NOTE — Progress Notes (Signed)
 Subjective  Chief Complaint  Patient presents with   Annual Exam    Pt here for Annual Exam and is currently fasting    Hyperlipidemia    HPI: Kelli Mcintyre is a 73 y.o. female who presents to [facility] [place] today for a Female Wellness Visit. She also has the concerns and/or needs as listed above in the chief complaint. These will be addressed in addition to the Health Maintenance Visit.   Wellness Visit: annual visit with health maintenance review and exam  HM: screens are current. Doing well. Walks for exercise. Works in garden. No falls. No mood concerns. Thinking is good. Imms UTD  Chronic disease f/u and/or acute problem visit: (deemed necessary to be done in addition to the wellness visit): Discussed the use of AI scribe software for clinical note transcription with the patient, who gave verbal consent to proceed.  History of Present Illness Kelli Mcintyre is a 73 year old female with hypertension who presents for blood pressure management.  Hypertension - Concerned about elevated blood pressure - Typical blood pressure readings around 118 mmHg, usually under 120 mmHg - Has not checked blood pressure recently and does not recall timing of last check - Currently taking  losartan  but after further interview: she stopped amlodipine  about 2 months ago to lower pill burden (only on 3 RX medications) - Discontinued nighttime dose of antihypertensive medication a couple of months ago, reason unknown - No chest pain, shortness of breath, or abdominal pain  Musculoskeletal pain - Experiences back pain - Attempts daily exercise by walking for thirty minutes, finds it awkward and challenging due to cold weather Has known djd and spinal stenosis. No weakness. No neurogenic claudication.  She used one of her sisters Lidoderm patches which was very effective.  Would like to get around.  No bowel or bladder problem.  Osteoarthritis of hands - Finger pain exacerbated by  cold weather or cold weather uses - Manages pain with heating pouch - Gel provides only partial relief - Frequent hand use for gardening worsens pain    Assessment  1. Encounter for well adult exam with abnormal findings   2. Essential hypertension   3. Mixed hyperlipidemia   4. Prediabetes   5. Spondylosis of lumbar region without myelopathy or radiculopathy   6. Spinal stenosis of lumbar region without neurogenic claudication   7. Osteopenia, unspecified location   8. Myalgia due to statin      Plan  Female Wellness Visit: Age appropriate Health Maintenance and Prevention measures were discussed with patient. Included topics are cancer screening recommendations, ways to keep healthy (see AVS) including dietary and exercise recommendations, regular eye and dental care, use of seat belts, and avoidance of moderate alcohol use and tobacco use.  BMI: discussed patient's BMI and encouraged positive lifestyle modifications to help get to or maintain a target BMI. HM needs and immunizations were addressed and ordered. See below for orders. See HM and immunization section for updates. Routine labs and screening tests ordered including cmp, cbc and lipids where appropriate. Discussed recommendations regarding Vit D and calcium  supplementation (see AVS)  Chronic disease management visit and/or acute problem visit: Assessment and Plan Assessment & Plan Adult Wellness Visit Routine wellness visit with discussion on medication adherence and over-the-counter vitamin use. - Ordered blood work - Reviewed current medications - Advised on appropriate use of over-the-counter vitamins  Essential hypertension Hypertension with recent non-adherence to medication. Blood pressure previously well-controlled with amlodipine  and  losartan . - Restart amlodipine  and continue losartan .  Gave education.  That should get her back to goal. - Reinforced importance of medication adherence - Scheduled follow-up  in six months to recheck blood pressure - Check renal function electrolytes  Mixed hyperlipidemia Managed with Zetia . - Continue Zetia , statin intolerant.  Recheck lipids and LFTs.  Lumbar spondylosis and lumbar spinal stenosis without neurogenic claudication Chronic back pain associated with lumbar spondylosis and stenosis. Symptoms exacerbated by cold weather and physical activity. - Recommended as needed use of Lidoderm patch.  Osteoarthritis of hands: Use Voltaren  gel 3 times daily to 4 times daily as needed.    Follow up: 6 months to recheck blood pressure Orders Placed This Encounter  Procedures   CBC with Differential/Platelet   Comprehensive metabolic panel with GFR   Lipid panel   Hemoglobin A1c   TSH   Meds ordered this encounter  Medications   lidocaine (LIDODERM) 5 %    Sig: Place 1 patch onto the skin daily as needed (back pain). Remove & Discard patch within 12 hours    Dispense:  14 patch    Refill:  3      Body mass index is 26.8 kg/m. Wt Readings from Last 3 Encounters:  09/28/24 137 lb 3.2 oz (62.2 kg)  06/13/24 140 lb 9.6 oz (63.8 kg)  03/23/24 140 lb 9.6 oz (63.8 kg)     Patient Active Problem List   Diagnosis Date Noted   Mixed hyperlipidemia 12/07/2018    Priority: High   Prediabetes 03/23/2018    Priority: High   Essential hypertension 06/15/2017    Priority: High    Has white coat component at times. Checks daily at home    Primary osteoarthritis of both hands 09/16/2022    Priority: Medium    Lumbar spinal stenosis 01/27/2020    Priority: Medium    Osteopenia 03/23/2018    Priority: Medium     Dexa 05/2021: lowest T = -1.1; recheck 2-3 years DEXA 04/2024: lowest T = -0.8; normal category. Recheck 3 years.     DJD (degenerative joint disease), lumbar 06/15/2017    Priority: Medium     MRI LUMBAR 06/29/17: 1. Lumbar facet arthropathy most advanced at L4-5 where there is anterolisthesis. Milder disc degeneration with generalized disc  bulging. 2. L4-5 advanced spinal stenosis. Moderate right foraminal impingement. 3. Noncompressive degenerative changes described above.      Overactive bladder 08/19/2021    Priority: Low    Mild; monitoring 08/2021    Myalgia due to statin 06/28/2019    Priority: Low   Asteroid hyalosis of right eye 09/11/2021   Pseudophakia of both eyes 09/11/2021   Epiretinal membrane, right eye 09/11/2021   Health Maintenance  Topic Date Due   Medicare Annual Wellness (AWV)  06/28/2024   COVID-19 Vaccine (5 - 2025-26 season) 10/14/2024 (Originally 07/18/2024)   Mammogram  07/01/2025   DEXA SCAN  05/05/2027   Colonoscopy  05/18/2027   DTaP/Tdap/Td (2 - Td or Tdap) 12/27/2027   Pneumococcal Vaccine: 50+ Years  Completed   Influenza Vaccine  Completed   Zoster Vaccines- Shingrix  Completed   Meningococcal B Vaccine  Aged Out   Hepatitis C Screening  Discontinued   Immunization History  Administered Date(s) Administered   Fluad Quad(high Dose 65+) 08/19/2021, 09/13/2022   Fluad Trivalent(High Dose 65+) 09/02/2023   Hepatitis A, Adult 11/23/2013, 05/23/2014   INFLUENZA, HIGH DOSE SEASONAL PF 08/20/2018, 08/16/2019, 08/13/2023, 09/02/2024   Influenza Split 08/25/2015, 08/30/2016  Influenza,inj,Quad PF,6+ Mos 07/29/2017, 07/22/2020   Influenza-Unspecified 09/15/2012, 08/11/2013, 08/21/2014   Moderna Covid-19 Vaccine Bivalent Booster 24yrs & up 09/05/2022   Moderna Sars-Covid-2 Vaccination 12/29/2019, 01/31/2020, 03/15/2021   Pneumococcal Conjugate Pcv21, Polysaccharide Crm197 Conjugaf 01/25/2024   Pneumococcal Conjugate-13 10/30/2014, 12/26/2017   Pneumococcal Polysaccharide-23 12/26/2018   Respiratory Syncytial Virus Vaccine,Recomb Aduvanted(Arexvy) 10/07/2022   Tdap 12/26/2017   Zoster Recombinant(Shingrix) 02/10/2018, 06/08/2018   Zoster, Live 07/26/2013   We updated and reviewed the patient's past history in detail and it is documented below. Allergies: Patient is allergic to  lipitor [atorvastatin  calcium ]. Past Medical History Patient  has a past medical history of Hyperlipemia, Hypertension, Lumbar spinal stenosis (01/27/2020), Overactive bladder (08/19/2021), and Urinary incontinence. Past Surgical History Patient  has a past surgical history that includes Cataract extraction (N/A) and Tubal ligation. Family History: Patient family history includes Diabetes in her mother; Hypertension in her mother. Social History:  Patient  reports that she has never smoked. She has never used smokeless tobacco. She reports that she does not drink alcohol and does not use drugs.  Review of Systems: Constitutional: negative for fever or malaise Ophthalmic: negative for photophobia, double vision or loss of vision Cardiovascular: negative for chest pain, dyspnea on exertion, or new LE swelling Respiratory: negative for SOB or persistent cough Gastrointestinal: negative for abdominal pain, change in bowel habits or melena Genitourinary: negative for dysuria or gross hematuria, no abnormal uterine bleeding or disharge Musculoskeletal: negative for new gait disturbance or muscular weakness Integumentary: negative for new or persistent rashes, no breast lumps Neurological: negative for TIA or stroke symptoms Psychiatric: negative for SI or delusions Allergic/Immunologic: negative for hives  Patient Care Team    Relationship Specialty Notifications Start End  Jodie Lavern CROME, MD PCP - General Family Medicine  01/27/20     Objective  Vitals: BP (!) 147/89   Pulse 74   Temp 98.1 F (36.7 C)   Ht 5' (1.524 m)   Wt 137 lb 3.2 oz (62.2 kg)   SpO2 97%   BMI 26.80 kg/m  General:  Well developed, well nourished, no acute distress  Psych:  Alert and orientedx3,normal mood and affect HEENT:  Normocephalic, atraumatic, non-icteric sclera,  supple neck without adenopathy, mass or thyromegaly Cardiovascular:  Normal S1, S2, RRR without gallop, rub or murmur Respiratory:  Good  breath sounds bilaterally, CTAB with normal respiratory effort Gastrointestinal: normal bowel sounds, soft, non-tender, no noted masses. No HSM MSK: extremities without edema, joints without erythema or swelling, OA changes in bilateral hands Neurologic:    Mental status is normal.  Gross motor and sensory exams are normal.  No tremor  Commons side effects, risks, benefits, and alternatives for medications and treatment plan prescribed today were discussed, and the patient expressed understanding of the given instructions. Patient is instructed to call or message via MyChart if he/she has any questions or concerns regarding our treatment plan. No barriers to understanding were identified. We discussed Red Flag symptoms and signs in detail. Patient expressed understanding regarding what to do in case of urgent or emergency type symptoms.  Medication list was reconciled, printed and provided to the patient in AVS. Patient instructions and summary information was reviewed with the patient as documented in the AVS. This note was prepared with assistance of Dragon voice recognition software. Occasional wrong-word or sound-a-like substitutions may have occurred due to the inherent limitations of voice recognition software

## 2024-09-29 ENCOUNTER — Telehealth: Payer: Self-pay

## 2024-09-29 ENCOUNTER — Other Ambulatory Visit (HOSPITAL_COMMUNITY): Payer: Self-pay

## 2024-09-29 LAB — LIPID PANEL
Cholesterol: 197 mg/dL (ref 0–200)
HDL: 54.4 mg/dL (ref >39.00)
LDL Cholesterol: 112 mg/dL — ABNORMAL HIGH (ref 0–99)
NonHDL: 142.71
Total CHOL/HDL Ratio: 4
Triglycerides: 156 mg/dL — ABNORMAL HIGH (ref 0.0–149.0)
VLDL: 31.2 mg/dL (ref 0.0–40.0)

## 2024-09-29 LAB — CBC WITH DIFFERENTIAL/PLATELET
Basophils Absolute: 0.1 K/uL (ref 0.0–0.1)
Basophils Relative: 0.7 % (ref 0.0–3.0)
Eosinophils Absolute: 0.1 K/uL (ref 0.0–0.7)
Eosinophils Relative: 1.3 % (ref 0.0–5.0)
HCT: 41.7 % (ref 36.0–46.0)
Hemoglobin: 14.1 g/dL (ref 12.0–15.0)
Lymphocytes Relative: 33.5 % (ref 12.0–46.0)
Lymphs Abs: 2.8 K/uL (ref 0.7–4.0)
MCHC: 33.8 g/dL (ref 30.0–36.0)
MCV: 88 fl (ref 78.0–100.0)
Monocytes Absolute: 0.7 K/uL (ref 0.1–1.0)
Monocytes Relative: 7.9 % (ref 3.0–12.0)
Neutro Abs: 4.7 K/uL (ref 1.4–7.7)
Neutrophils Relative %: 56.6 % (ref 43.0–77.0)
Platelets: 231 K/uL (ref 150.0–400.0)
RBC: 4.73 Mil/uL (ref 3.87–5.11)
RDW: 13.9 % (ref 11.5–15.5)
WBC: 8.4 K/uL (ref 4.0–10.5)

## 2024-09-29 LAB — COMPREHENSIVE METABOLIC PANEL WITH GFR
ALT: 17 U/L (ref 0–35)
AST: 26 U/L (ref 0–37)
Albumin: 4.4 g/dL (ref 3.5–5.2)
Alkaline Phosphatase: 36 U/L — ABNORMAL LOW (ref 39–117)
BUN: 20 mg/dL (ref 6–23)
CO2: 28 meq/L (ref 19–32)
Calcium: 9.7 mg/dL (ref 8.4–10.5)
Chloride: 101 meq/L (ref 96–112)
Creatinine, Ser: 1.1 mg/dL (ref 0.40–1.20)
GFR: 49.49 mL/min — ABNORMAL LOW (ref >60.00)
Glucose, Bld: 86 mg/dL (ref 70–99)
Potassium: 4.5 meq/L (ref 3.5–5.1)
Sodium: 137 meq/L (ref 135–145)
Total Bilirubin: 0.6 mg/dL (ref 0.2–1.2)
Total Protein: 7.8 g/dL (ref 6.0–8.3)

## 2024-09-29 LAB — TSH: TSH: 9.11 u[IU]/mL — ABNORMAL HIGH (ref 0.35–5.50)

## 2024-09-29 LAB — HEMOGLOBIN A1C: Hgb A1c MFr Bld: 6.3 % (ref 4.6–6.5)

## 2024-09-29 NOTE — Telephone Encounter (Signed)
 Pharmacy Patient Advocate Encounter  Received notification from [employer] that Prior Authorization for Lidocaine 5% patches  has been APPROVED from 09/29/24 to 11/16/25   PA #/Case ID/Reference #: [id number]

## 2024-09-29 NOTE — Telephone Encounter (Signed)
 Pharmacy Patient Advocate Encounter   Received notification from Onbase that prior authorization for Lidocaine 5% patches is required/requested.   Insurance verification completed.   The patient is insured through [employer].   Per test claim: PA required; PA submitted to above mentioned insurance via Latent Key/confirmation #/EOC [id number] Status is pending

## 2024-09-30 ENCOUNTER — Other Ambulatory Visit: Payer: Self-pay

## 2024-09-30 ENCOUNTER — Ambulatory Visit: Payer: Self-pay | Admitting: Family Medicine

## 2024-09-30 DIAGNOSIS — E039 Hypothyroidism, unspecified: Secondary | ICD-10-CM | POA: Insufficient documentation

## 2024-09-30 MED ORDER — LEVOTHYROXINE SODIUM 25 MCG PO TABS
25.0000 ug | ORAL_TABLET | Freq: Every day | ORAL | 3 refills | Status: AC
Start: 2024-09-30 — End: ?

## 2024-09-30 NOTE — Progress Notes (Signed)
 Please call patient:labs show a few new abnormalities. May need to talk to patients daughter due to language barrier. Near diabetic range now and thyroid  is off.  Please start levothyroxine 25mcg daily and limit sweets. I want to see her back in 3 months to recheck levels. Other labs are ok.

## 2024-10-31 ENCOUNTER — Encounter: Payer: Self-pay | Admitting: Pharmacist

## 2024-10-31 NOTE — Progress Notes (Unsigned)
 Pharmacy Quality Measure Review  This patient is appearing on a report for being at risk of failing the adherence measure for hypertension (ACEi/ARB) medications this calendar year.   Medication: losartan   Last fill date: 07/26/2024 for 90 day supply  BP Readings from Last 3 Encounters:  09/28/24 (!) 147/89  06/13/24 120/74  03/23/24 114/75   Reviewed recent refill history in Dr Annemarie database. Actual last refill date was 10/23/2024 for 90 day supply. Patient has no refills remaining. Next appointment with PCP is 03/30/2025.   Patient was suppose to restart amlodipine  after last visit in November 2025.   Tried to call patient thru Korean interpreter [facility] [phone] Left voicemail for patient to return my call at their convenience. Requested she call office with home blood pressure numbers and make sure she restarted amlodipine .   While leaving message patient picked up and her husband was also on the call.  Patient has been checking blood pressure. Reported that following numbers: today was 110/80, 107/70 and 120/78.   They also has a question about why she was prescribed levothyroxine . They thought it was for her blood glucose but last A1c and home blood glucose has been normal recently.   Explained that her TSH was elevated and this indicated that she body is trying to make more thyroid  hormone / or that her thyroid  hormone levels are low. Levothyroxine  is synthetic thyroid  replacement.  Mr. Pavone states he will look at labs again and call if he has questions.   Madelin Ray, PharmD Clinical Pharmacist [facility]  Population Health [phone]

## 2024-11-01 ENCOUNTER — Encounter: Payer: Self-pay | Admitting: Pharmacist

## 2024-11-02 ENCOUNTER — Telehealth: Payer: Self-pay

## 2024-11-02 NOTE — Telephone Encounter (Signed)
 Follow-up appointment made for 12/28/2024@ 800.   Copied from CRM #[id number]. Topic: Clinical - Lab/Test Results >> Nov 02, 2024 10:10 AM Franky GRADE wrote: Reason for CRM: Patient's husband is calling regarding TSH lab results. He would like to speak with a CMA if possible.

## 2024-12-28 ENCOUNTER — Ambulatory Visit: Admitting: Family Medicine

## 2025-03-30 ENCOUNTER — Ambulatory Visit: Admitting: Family Medicine

## 2025-09-29 ENCOUNTER — Encounter: Admitting: Family Medicine
